# Patient Record
Sex: Female | Born: 1970 | Race: White | Hispanic: No | Marital: Married | Smoking: Former smoker
Health system: Southern US, Community
[De-identification: ages and names within clinical notes are randomized; demographics above are authoritative.]

## PROBLEM LIST (undated history)

## (undated) DIAGNOSIS — F419 Anxiety disorder, unspecified: Secondary | ICD-10-CM

## (undated) DIAGNOSIS — F909 Attention-deficit hyperactivity disorder, unspecified type: Secondary | ICD-10-CM

## (undated) DIAGNOSIS — J45909 Unspecified asthma, uncomplicated: Secondary | ICD-10-CM

## (undated) HISTORY — PX: FOOT SURGERY: SHX648

## (undated) HISTORY — DX: Unspecified asthma, uncomplicated: J45.909

## (undated) HISTORY — DX: Anxiety disorder, unspecified: F41.9

## (undated) HISTORY — DX: Attention-deficit hyperactivity disorder, unspecified type: F90.9

---

## 2016-04-21 DIAGNOSIS — M9901 Segmental and somatic dysfunction of cervical region: Secondary | ICD-10-CM | POA: Diagnosis not present

## 2016-04-21 DIAGNOSIS — M9905 Segmental and somatic dysfunction of pelvic region: Secondary | ICD-10-CM | POA: Diagnosis not present

## 2016-04-21 DIAGNOSIS — M62838 Other muscle spasm: Secondary | ICD-10-CM | POA: Diagnosis not present

## 2016-04-21 DIAGNOSIS — M5432 Sciatica, left side: Secondary | ICD-10-CM | POA: Diagnosis not present

## 2016-04-25 DIAGNOSIS — M9901 Segmental and somatic dysfunction of cervical region: Secondary | ICD-10-CM | POA: Diagnosis not present

## 2016-04-25 DIAGNOSIS — M62838 Other muscle spasm: Secondary | ICD-10-CM | POA: Diagnosis not present

## 2016-04-25 DIAGNOSIS — M5432 Sciatica, left side: Secondary | ICD-10-CM | POA: Diagnosis not present

## 2016-04-25 DIAGNOSIS — M9905 Segmental and somatic dysfunction of pelvic region: Secondary | ICD-10-CM | POA: Diagnosis not present

## 2016-05-10 DIAGNOSIS — M5432 Sciatica, left side: Secondary | ICD-10-CM | POA: Diagnosis not present

## 2016-05-10 DIAGNOSIS — M9903 Segmental and somatic dysfunction of lumbar region: Secondary | ICD-10-CM | POA: Diagnosis not present

## 2016-05-12 DIAGNOSIS — M5432 Sciatica, left side: Secondary | ICD-10-CM | POA: Diagnosis not present

## 2016-05-12 DIAGNOSIS — M9903 Segmental and somatic dysfunction of lumbar region: Secondary | ICD-10-CM | POA: Diagnosis not present

## 2016-06-28 DIAGNOSIS — Z79899 Other long term (current) drug therapy: Secondary | ICD-10-CM | POA: Diagnosis not present

## 2016-06-28 DIAGNOSIS — F419 Anxiety disorder, unspecified: Secondary | ICD-10-CM | POA: Diagnosis not present

## 2016-06-28 DIAGNOSIS — R41840 Attention and concentration deficit: Secondary | ICD-10-CM | POA: Diagnosis not present

## 2016-06-28 DIAGNOSIS — F902 Attention-deficit hyperactivity disorder, combined type: Secondary | ICD-10-CM | POA: Diagnosis not present

## 2016-07-19 DIAGNOSIS — F419 Anxiety disorder, unspecified: Secondary | ICD-10-CM | POA: Diagnosis not present

## 2016-07-19 DIAGNOSIS — Z79899 Other long term (current) drug therapy: Secondary | ICD-10-CM | POA: Diagnosis not present

## 2016-07-19 DIAGNOSIS — F902 Attention-deficit hyperactivity disorder, combined type: Secondary | ICD-10-CM | POA: Diagnosis not present

## 2016-07-19 DIAGNOSIS — F88 Other disorders of psychological development: Secondary | ICD-10-CM | POA: Diagnosis not present

## 2016-07-21 DIAGNOSIS — M9903 Segmental and somatic dysfunction of lumbar region: Secondary | ICD-10-CM | POA: Diagnosis not present

## 2016-07-21 DIAGNOSIS — M5432 Sciatica, left side: Secondary | ICD-10-CM | POA: Diagnosis not present

## 2016-07-22 DIAGNOSIS — F902 Attention-deficit hyperactivity disorder, combined type: Secondary | ICD-10-CM | POA: Diagnosis not present

## 2016-07-22 DIAGNOSIS — F338 Other recurrent depressive disorders: Secondary | ICD-10-CM | POA: Diagnosis not present

## 2016-07-25 DIAGNOSIS — M9903 Segmental and somatic dysfunction of lumbar region: Secondary | ICD-10-CM | POA: Diagnosis not present

## 2016-07-25 DIAGNOSIS — M5432 Sciatica, left side: Secondary | ICD-10-CM | POA: Diagnosis not present

## 2016-07-28 DIAGNOSIS — M9903 Segmental and somatic dysfunction of lumbar region: Secondary | ICD-10-CM | POA: Diagnosis not present

## 2016-07-28 DIAGNOSIS — M5432 Sciatica, left side: Secondary | ICD-10-CM | POA: Diagnosis not present

## 2016-08-02 DIAGNOSIS — M5432 Sciatica, left side: Secondary | ICD-10-CM | POA: Diagnosis not present

## 2016-08-02 DIAGNOSIS — M9903 Segmental and somatic dysfunction of lumbar region: Secondary | ICD-10-CM | POA: Diagnosis not present

## 2016-08-04 DIAGNOSIS — M9903 Segmental and somatic dysfunction of lumbar region: Secondary | ICD-10-CM | POA: Diagnosis not present

## 2016-08-04 DIAGNOSIS — M5432 Sciatica, left side: Secondary | ICD-10-CM | POA: Diagnosis not present

## 2016-08-05 DIAGNOSIS — Z79899 Other long term (current) drug therapy: Secondary | ICD-10-CM | POA: Diagnosis not present

## 2016-08-05 DIAGNOSIS — F338 Other recurrent depressive disorders: Secondary | ICD-10-CM | POA: Diagnosis not present

## 2016-08-05 DIAGNOSIS — F419 Anxiety disorder, unspecified: Secondary | ICD-10-CM | POA: Diagnosis not present

## 2016-08-05 DIAGNOSIS — F902 Attention-deficit hyperactivity disorder, combined type: Secondary | ICD-10-CM | POA: Diagnosis not present

## 2016-09-13 DIAGNOSIS — J0100 Acute maxillary sinusitis, unspecified: Secondary | ICD-10-CM | POA: Diagnosis not present

## 2016-09-13 DIAGNOSIS — J301 Allergic rhinitis due to pollen: Secondary | ICD-10-CM | POA: Diagnosis not present

## 2016-09-13 DIAGNOSIS — J4520 Mild intermittent asthma, uncomplicated: Secondary | ICD-10-CM | POA: Diagnosis not present

## 2016-09-27 DIAGNOSIS — R635 Abnormal weight gain: Secondary | ICD-10-CM | POA: Diagnosis not present

## 2016-09-27 DIAGNOSIS — Z0000 Encounter for general adult medical examination without abnormal findings: Secondary | ICD-10-CM | POA: Diagnosis not present

## 2016-09-27 DIAGNOSIS — Z23 Encounter for immunization: Secondary | ICD-10-CM | POA: Diagnosis not present

## 2016-10-07 ENCOUNTER — Other Ambulatory Visit (HOSPITAL_COMMUNITY)
Admission: RE | Admit: 2016-10-07 | Discharge: 2016-10-07 | Disposition: A | Discharge status: Home / Self Care | Payer: Federal, State, Local not specified - PPO | Source: Ambulatory Visit | Attending: Obstetrics & Gynecology | Admitting: Obstetrics & Gynecology

## 2016-10-07 ENCOUNTER — Other Ambulatory Visit: Payer: Self-pay | Admitting: Obstetrics & Gynecology

## 2016-10-07 DIAGNOSIS — Z1151 Encounter for screening for human papillomavirus (HPV): Secondary | ICD-10-CM | POA: Diagnosis not present

## 2016-10-07 DIAGNOSIS — Z01419 Encounter for gynecological examination (general) (routine) without abnormal findings: Secondary | ICD-10-CM | POA: Diagnosis not present

## 2016-10-11 LAB — CYTOLOGY - PAP
Diagnosis: NEGATIVE
HPV: NOT DETECTED

## 2016-10-26 ENCOUNTER — Encounter: Payer: Self-pay | Admitting: Allergy and Immunology

## 2016-10-26 ENCOUNTER — Ambulatory Visit (INDEPENDENT_AMBULATORY_CARE_PROVIDER_SITE_OTHER): Payer: Federal, State, Local not specified - PPO | Admitting: Allergy and Immunology

## 2016-10-26 VITALS — BP 116/84 | HR 77 | Temp 98.0°F | Resp 16 | Ht 68.0 in | Wt 228.6 lb

## 2016-10-26 DIAGNOSIS — J3089 Other allergic rhinitis: Secondary | ICD-10-CM

## 2016-10-26 DIAGNOSIS — Z91018 Allergy to other foods: Secondary | ICD-10-CM | POA: Diagnosis not present

## 2016-10-26 DIAGNOSIS — L718 Other rosacea: Secondary | ICD-10-CM | POA: Diagnosis not present

## 2016-10-26 DIAGNOSIS — J4520 Mild intermittent asthma, uncomplicated: Secondary | ICD-10-CM | POA: Diagnosis not present

## 2016-10-26 DIAGNOSIS — J4540 Moderate persistent asthma, uncomplicated: Secondary | ICD-10-CM | POA: Insufficient documentation

## 2016-10-26 DIAGNOSIS — Z881 Allergy status to other antibiotic agents status: Secondary | ICD-10-CM | POA: Diagnosis not present

## 2016-10-26 MED ORDER — AZELASTINE HCL 0.15 % NA SOLN: 2.0000 | Freq: Two times a day (BID) | NASAL | 5 refills | Status: DC

## 2016-10-26 NOTE — Assessment & Plan Note (Signed)
Skin tests to select food allergens were negative today. The negative predictive value of food allergen skin testing is excellent (approximately 95%). While this does not appear to be an IgE mediated issue, skin testing does not rule out food intolerances. This is suggested when elimination of the responsible food leads to symptom resolution and re-introduction of the food is followed by the return of symptoms.   The patient has been encouraged to keep a careful symptom/food journal and eliminate any food suspected of correlating with symptoms.   If symptoms persist or progress, rheumatologist evaluation may be warranted.

## 2016-10-26 NOTE — Progress Notes (Signed)
New Patient Note  RE: Wendy Shelton MRN: [id number] DOB: Mar 24, 1971 Date of Office Visit: 10/26/2016  Referring provider: Lorenda Ishihara,* Primary care provider: Lorenda Ishihara, MD  Chief Complaint: Allergic Reaction (Amoxicillin); Allergic Rhinitis ; and Asthma   History of present illness: Wendy Shelton is a 46 y.o. female seen today in consultation requested by Lorenda Ishihara, MD.  In April, she was prescribed amoxicillin and within 3-5 days of starting this course developed a "bad reaction" in the form of a rash.  The rash is described as tiny, red, bumps on her arms, abdomen, chest, and back.  The rash was exquisitely pruritic and she believes that she may have experienced concomitant dyspnea.The amoxicillin was discontinued approximately 2 days after the onset of symptoms and the rash lasted for an additional 2 weeks prior to resolving. The patient experiences coughing and wheezing with exertion.  She was diagnosed with exercise-induced bronchospasm during her mid 92s while living in [place].  She experiences postnasal drainage, throat clearing, coughing, sneezing, nasal congestion.  These symptoms have increased in frequency/severity since moving from [place] to [place] in October 2017. She develops red cheeks, red nose, and her nose becomes somewhat bulbous when she is in warm/hot environments and when she consumes red wine.  When she consumes large quantities of gluten, dairy, and/or sugar she experiences joint inflammation. She does not experience cutaneous, cardiopulmonary, or GI symptoms with the consumption of gluten, dairy, or sugar.   Assessment and plan: Drug allergy, antibiotic [name]'s history strongly suggests allergic reaction to amoxicillin.  Given the high pretest probability, low sensitivity/specificity of skin testing and risk involved with a challenge, the presumptive diagnosis of drug allergy is appropriate.   Continue  avoidance of this class of amoxicillin/penicillin.  Should symptoms recur in the absence of amoxicillin/penicillin, a journal is to be kept recording any foods eaten, beverages consumed, medications taken, activities performed, and environmental conditions within a 6 hour time period prior to the onset of symptoms. For any symptoms concerning for anaphylaxis, epinephrine is to be administered and 911 is to be called immediately.   Perennial and seasonal allergic rhinitis  Aeroallergen avoidance measures have been discussed and provided in written form.  A prescription has been provided for Dymista (azelastine/fluticasone) nasal spray, 1 spray per nostril twice daily as needed. Proper nasal spray technique has been discussed and demonstrated.  I have also recommended nasal saline spray (i.e., Simply Saline) or nasal saline lavage (i.e., NeilMed) as needed and prior to medicated nasal sprays.  For thick post nasal drainage, add guaifenesin 1200 mg (Mucinex Maximum Strength)  twice daily as needed with adequate hydration as discussed.  If allergen avoidance measures and medications fail to adequately relieve symptoms, aeroallergen immunotherapy will be considered.  Rosacea The patient's history suggests rosacea.  Avoidance of triggers, including heat and red wine.  If this problem persists or progresses despite trigger avoidance, follow up with dermatology for further treatment.  History of food allergy Skin tests to select food allergens were negative today. The negative predictive value of food allergen skin testing is excellent (approximately 95%). While this does not appear to be an IgE mediated issue, skin testing does not rule out food intolerances. This is suggested when elimination of the responsible food leads to symptom resolution and re-introduction of the food is followed by the return of symptoms.   The patient has been encouraged to keep a careful symptom/food journal and  eliminate any food suspected of correlating with symptoms.  If symptoms persist or progress, rheumatologist evaluation may be warranted.  Mild intermittent asthma  Continue albuterol HFA, 1-2 inhalations every 4-6 hours as needed.  Subjective and objective measures of pulmonary function will be followed and the treatment plan will be adjusted accordingly.   Meds ordered this encounter  Medications  . Azelastine HCl 0.15 % SOLN    Sig: Place 2 sprays into both nostrils 2 (two) times daily.    Dispense:  30 mL    Refill:  5    Diagnostics: Spirometry: FVC was 3.48 L and FEV1 was 3.02 L (85% predicted) with 150 mL postbronchodilator improvement. This study was performed while the patient was asymptomatic.  Please see scanned spirometry results for details. Epicutaneous testing: Positive to grass pollens, cat hair, and dust mite antigen. Intradermal testing: Positive to ragweed pollen, weed pollens, tree pollens, molds, dog epithelia, and cockroach antigen.    Physical examination: Blood pressure 116/84, pulse 77, temperature 98 F (36.7 C), temperature source Oral, resp. rate 16, height 5\' 8"  (1.727 m), weight 228 lb 9.9 oz (103.7 kg), SpO2 99 %.  General: Alert, interactive, in no acute distress. HEENT: TMs pearly gray, turbinates edematous with thick discharge, post-pharynx moderately erythematous. Neck: Supple without lymphadenopathy. Lungs: Clear to auscultation without wheezing, rhonchi or rales. CV: Normal S1, S2 without murmurs. Abdomen: Nondistended, nontender. Skin: Warm and dry, without lesions or rashes. Extremities:  No clubbing, cyanosis or edema. Neuro:   Grossly intact.  Review of systems:  Review of systems negative except as noted in HPI / PMHx or noted below: Review of Systems  Constitutional: Negative.   HENT: Negative.   Eyes: Negative.   Respiratory: Negative.   Cardiovascular: Negative.   Gastrointestinal: Negative.   Genitourinary: Negative.     Musculoskeletal: Negative.   Skin: Negative.   Neurological: Negative.   Endo/Heme/Allergies: Negative.   Psychiatric/Behavioral: Negative.     Past medical history:  Past Medical History:  Diagnosis Date  . ADHD   . Anxiety   . Asthma     Past surgical history:  Past Surgical History:  Procedure Laterality Date  . no past surgery      Family history: Family History  Problem Relation Age of Onset  . Allergic rhinitis Mother   . Asthma Brother   . Angioedema Neg Hx   . Eczema Neg Hx   . Immunodeficiency Neg Hx   . Urticaria Neg Hx     Social history: Social History   Social History  . Marital status: Single    Spouse name: N/A  . Number of children: N/A  . Years of education: N/A   Occupational History  . Not on file.   Social History Main Topics  . Smoking status: Former Smoker    Packs/day: 0.25    Years: 15.00    Types: Cigarettes  . Smokeless tobacco: Never Used  . Alcohol use Yes  . Drug use: No  . Sexual activity: Not on file   Other Topics Concern  . Not on file   Social History Narrative  . No narrative on file   Environmental History: The patient lives in a 44 year old house with hardwood floors throughout and central air/heat.  There is a dog in the house and has access to her bedroom.  There is no known mold/water damage in the home.  She started smoking in 1988 and quit in 2009  Allergies as of 10/26/2016      Reactions   Amoxicillin    rash  Keflex [cephalexin]    rash   Sulfa Antibiotics    Rash      Medication List       Accurate as of 10/26/16  6:21 PM. Always use your most recent med list.          ALPRAZolam 0.25 MG tablet Commonly known as:  XANAX Take 0.25 mg by mouth at bedtime as needed for anxiety.   atomoxetine 40 MG capsule Commonly known as:  STRATTERA TK 1 C PO D   Azelastine HCl 0.15 % Soln Place 2 sprays into both nostrils 2 (two) times daily.   dexmethylphenidate 20 MG 24 hr capsule Commonly known  as:  FOCALIN XR TK 1 C PO D   mupirocin ointment 2 % Commonly known as:  BACTROBAN APP EXT AA TID FOR 5 DAYS   PROAIR HFA 108 (90 Base) MCG/ACT inhaler Generic drug:  albuterol INL 2 PFS PO Q 6 H PRN       Known medication allergies: Allergies  Allergen Reactions  . Amoxicillin     rash  . Keflex [Cephalexin]     rash  . Sulfa Antibiotics     Rash     I appreciate the opportunity to take part in [name]'s care. Please do not hesitate to contact me with questions.  Sincerely,   R. Jorene Guestarter Leila Schuff, MD

## 2016-10-26 NOTE — Assessment & Plan Note (Signed)
   Continue albuterol HFA, 1-2 inhalations every 4-6 hours as needed.  Subjective and objective measures of pulmonary function will be followed and the treatment plan will be adjusted accordingly. 

## 2016-10-26 NOTE — Assessment & Plan Note (Signed)
   Aeroallergen avoidance measures have been discussed and provided in written form.  A prescription has been provided for Dymista (azelastine/fluticasone) nasal spray, 1 spray per nostril twice daily as needed. Proper nasal spray technique has been discussed and demonstrated.  I have also recommended nasal saline spray (i.e., Simply Saline) or nasal saline lavage (i.e., NeilMed) as needed and prior to medicated nasal sprays.  For thick post nasal drainage, add guaifenesin 1200 mg (Mucinex Maximum Strength)  twice daily as needed with adequate hydration as discussed.  If allergen avoidance measures and medications fail to adequately relieve symptoms, aeroallergen immunotherapy will be considered.

## 2016-10-26 NOTE — Patient Instructions (Addendum)
Drug allergy, antibiotic [name]'s history strongly suggests allergic reaction to amoxicillin.  Given the high pretest probability, low sensitivity/specificity of skin testing and risk involved with a challenge, the presumptive diagnosis of drug allergy is appropriate.   Continue avoidance of this class of amoxicillin/penicillin.  Should symptoms recur in the absence of amoxicillin/penicillin, a journal is to be kept recording any foods eaten, beverages consumed, medications taken, activities performed, and environmental conditions within a 6 hour time period prior to the onset of symptoms. For any symptoms concerning for anaphylaxis, epinephrine is to be administered and 911 is to be called immediately.   Perennial and seasonal allergic rhinitis  Aeroallergen avoidance measures have been discussed and provided in written form.  A prescription has been provided for Dymista (azelastine/fluticasone) nasal spray, 1 spray per nostril twice daily as needed. Proper nasal spray technique has been discussed and demonstrated.  I have also recommended nasal saline spray (i.e., Simply Saline) or nasal saline lavage (i.e., NeilMed) as needed and prior to medicated nasal sprays.  For thick post nasal drainage, add guaifenesin 1200 mg (Mucinex Maximum Strength)  twice daily as needed with adequate hydration as discussed.  If allergen avoidance measures and medications fail to adequately relieve symptoms, aeroallergen immunotherapy will be considered.  Rosacea The patient's history suggests rosacea.  Avoidance of triggers, including heat and red wine.  If this problem persists or progresses despite trigger avoidance, follow up with dermatology for further treatment.  History of food allergy Skin tests to select food allergens were negative today. The negative predictive value of food allergen skin testing is excellent (approximately 95%). While this does not appear to be an IgE mediated issue, skin  testing does not rule out food intolerances. This is suggested when elimination of the responsible food leads to symptom resolution and re-introduction of the food is followed by the return of symptoms.   The patient has been encouraged to keep a careful symptom/food journal and eliminate any food suspected of correlating with symptoms.   If symptoms persist or progress, rheumatologist evaluation may be warranted.  Mild intermittent asthma  Continue albuterol HFA, 1-2 inhalations every 4-6 hours as needed.  Subjective and objective measures of pulmonary function will be followed and the treatment plan will be adjusted accordingly.   Return in about 4 months (around 02/25/2017), or if symptoms worsen or fail to improve.  Reducing Pollen Exposure  The American Academy of Allergy, Asthma and Immunology suggests the following steps to reduce your exposure to pollen during allergy seasons.    1. Do not hang sheets or clothing out to dry; pollen may collect on these items. 2. Do not mow lawns or spend time around freshly cut grass; mowing stirs up pollen. 3. Keep windows closed at night.  Keep car windows closed while driving. 4. Minimize morning activities outdoors, a time when pollen counts are usually at their highest. 5. Stay indoors as much as possible when pollen counts or humidity is high and on windy days when pollen tends to remain in the air longer. 6. Use air conditioning when possible.  Many air conditioners have filters that trap the pollen spores. 7. Use a HEPA room air filter to remove pollen form the indoor air you breathe.   Control of House Dust Mite Allergen  House dust mites play a major role in allergic asthma and rhinitis.  They occur in environments with high humidity wherever human skin, the food for dust mites is found. High levels have been detected in dust  obtained from mattresses, pillows, carpets, upholstered furniture, bed covers, clothes and soft toys.  The  principal allergen of the house dust mite is found in its feces.  A gram of dust may contain 1,000 mites and 250,000 fecal particles.  Mite antigen is easily measured in the air during house cleaning activities.    1. Encase mattresses, including the box spring, and pillow, in an air tight cover.  Seal the zipper end of the encased mattresses with wide adhesive tape. 2. Wash the bedding in water of 130 degrees Farenheit weekly.  Avoid cotton comforters/quilts and flannel bedding: the most ideal bed covering is the dacron comforter. 3. Remove all upholstered furniture from the bedroom. 4. Remove carpets, carpet padding, rugs, and non-washable window drapes from the bedroom.  Wash drapes weekly or use plastic window coverings. 5. Remove all non-washable stuffed toys from the bedroom.  Wash stuffed toys weekly. 6. Have the room cleaned frequently with a vacuum cleaner and a damp dust-mop.  The patient should not be in a room which is being cleaned and should wait 1 hour after cleaning before going into the room. 7. Close and seal all heating outlets in the bedroom.  Otherwise, the room will become filled with dust-laden air.  An electric heater can be used to heat the room. Reduce indoor humidity to less than 50%.  Do not use a humidifier.  Control of Dog or Cat Allergen  Avoidance is the best way to manage a dog or cat allergy. If you have a dog or cat and are allergic to dog or cats, consider removing the dog or cat from the home. If you have a dog or cat but don't want to find it a new home, or if your family wants a pet even though someone in the household is allergic, here are some strategies that may help keep symptoms at bay:  1. Keep the pet out of your bedroom and restrict it to only a few rooms. Be advised that keeping the dog or cat in only one room will not limit the allergens to that room. 2. Don't pet, hug or kiss the dog or cat; if you do, wash your hands with soap and  water. 3. High-efficiency particulate air (HEPA) cleaners run continuously in a bedroom or living room can reduce allergen levels over time. 4. Regular use of a high-efficiency vacuum cleaner or a central vacuum can reduce allergen levels. 5. Giving your dog or cat a bath at least once a week can reduce airborne allergen.  Control of Mold Allergen  Mold and fungi can grow on a variety of surfaces provided certain temperature and moisture conditions exist.  Outdoor molds grow on plants, decaying vegetation and soil.  The major outdoor mold, Alternaria and Cladosporium, are found in very high numbers during hot and dry conditions.  Generally, a late Summer - Fall peak is seen for common outdoor fungal spores.  Rain will temporarily lower outdoor mold spore count, but counts rise rapidly when the rainy period ends.  The most important indoor molds are Aspergillus and Penicillium.  Dark, humid and poorly ventilated basements are ideal sites for mold growth.  The next most common sites of mold growth are the bathroom and the kitchen.  Outdoor [employer] 1. Use air conditioning and keep windows closed 2. Avoid exposure to decaying vegetation. 3. Avoid leaf raking. 4. Avoid grain handling. 5. Consider wearing a face mask if working in moldy areas.  Indoor Mold Control 1. Maintain  humidity below 50%. 2. Clean washable surfaces with 5% bleach solution. 3. Remove sources e.g. Contaminated carpets.  Control of Cockroach Allergen  Cockroach allergen has been identified as an important cause of acute attacks of asthma, especially in urban settings.  There are fifty-five species of cockroach that exist in the [place], however only three, the [place], [place] species produce allergen that can affect patients with Asthma.  Allergens can be obtained from fecal particles, egg casings and secretions from cockroaches.    1. Remove food sources. 2. Reduce access to water. 3. Seal access  and entry points. 4. Spray runways with 0.5-1% Diazinon or Chlorpyrifos 5. Blow boric acid power under stoves and refrigerator. 6. Place bait stations (hydramethylnon) at feeding sites.

## 2016-10-26 NOTE — Assessment & Plan Note (Signed)
The patient's history suggests rosacea.  Avoidance of triggers, including heat and red wine.  If this problem persists or progresses despite trigger avoidance, follow up with dermatology for further treatment.

## 2016-10-26 NOTE — Assessment & Plan Note (Addendum)
Wendy Shelton's history strongly suggests allergic reaction to amoxicillin.  Given the high pretest probability, low sensitivity/specificity of skin testing and risk involved with a challenge, the presumptive diagnosis of drug allergy is appropriate.   Continue avoidance of this class of amoxicillin/penicillin.  Should symptoms recur in the absence of amoxicillin/penicillin, a journal is to be kept recording any foods eaten, beverages consumed, medications taken, activities performed, and environmental conditions within a 6 hour time period prior to the onset of symptoms. For any symptoms concerning for anaphylaxis, epinephrine is to be administered and 911 is to be called immediately.

## 2016-11-01 ENCOUNTER — Ambulatory Visit (INDEPENDENT_AMBULATORY_CARE_PROVIDER_SITE_OTHER): Payer: Federal, State, Local not specified - PPO | Admitting: Podiatry

## 2016-11-01 ENCOUNTER — Encounter: Payer: Self-pay | Admitting: Podiatry

## 2016-11-01 ENCOUNTER — Ambulatory Visit (INDEPENDENT_AMBULATORY_CARE_PROVIDER_SITE_OTHER): Payer: Federal, State, Local not specified - PPO

## 2016-11-01 DIAGNOSIS — M2012 Hallux valgus (acquired), left foot: Secondary | ICD-10-CM

## 2016-11-01 DIAGNOSIS — M2042 Other hammer toe(s) (acquired), left foot: Secondary | ICD-10-CM

## 2016-11-01 NOTE — Progress Notes (Signed)
   Subjective:    Patient ID: Wendy Shelton, female    DOB: 06/01/1970, 46 y.o.   MRN: [id number]  HPI: She presents today with a chief concern of pain to the first metatarsophalangeal joint left foot she is also concerned about deviation of the second toe left foot and painful fifth metatarsal phalangeal joint left. She states is an aching for several months has been going on for a couple of years on and off. She states that she had a plantar plate tear couple of years ago around the second metatarsophalangeal joint which was booted for about 2 months and resolved. She states that now the second toe is starting to curl and leaning toward her hallux. She denies any problems with her health in the past.    Review of Systems  Musculoskeletal: Positive for arthralgias.  All other systems reviewed and are negative.      Objective:   Physical Exam: I have reviewed her past medical history medications allergy surgery social history. She is in no acute distress. Pulses are strongly palpable. Neurologic sensorium is intact. Deep tendon reflexes are intact. Muscle strength +5 over 5 dorsiflexion plantar flexors and inverters everters all intrinsic musculature is intact. Orthopedic evaluation demonstrates hallux abductovalgus deformity of the left foot. Contracted second metatarsophalangeal joint with medial deviation and hammertoe deformity second digit left. Tailor's bunion deformity is also present painful on palpation and range of motion. Radiographs taken today demonstrate an increase in the first and fourth intermetatarsal angles hallux abductus angle is increased greater than normal value. An elongated second metatarsal was noted resulting in hammertoe deformity rigid hammertoe deformity at the level of the second toe. No open lesions or wounds are noted. She also has hammertoe deformities to toes #3 and 4.        Assessment & Plan:  Hallux abductovalgus deformity left foot plantarflexed  elongated second metatarsal left foot hammertoe #2 #3 #4 of the left foot. Tailor's bunion deformity fifth left.  Plan: Discussed etiology pathology conservative versus surgical therapies. We will follow-up with her in the fall for urgent consideration.

## 2016-11-07 ENCOUNTER — Ambulatory Visit: Payer: Self-pay | Admitting: Allergy & Immunology

## 2016-11-15 DIAGNOSIS — Z1231 Encounter for screening mammogram for malignant neoplasm of breast: Secondary | ICD-10-CM | POA: Diagnosis not present

## 2016-12-01 DIAGNOSIS — Z79899 Other long term (current) drug therapy: Secondary | ICD-10-CM | POA: Diagnosis not present

## 2016-12-01 DIAGNOSIS — F1920 Other psychoactive substance dependence, uncomplicated: Secondary | ICD-10-CM | POA: Diagnosis not present

## 2016-12-01 DIAGNOSIS — F419 Anxiety disorder, unspecified: Secondary | ICD-10-CM | POA: Diagnosis not present

## 2016-12-01 DIAGNOSIS — F902 Attention-deficit hyperactivity disorder, combined type: Secondary | ICD-10-CM | POA: Diagnosis not present

## 2016-12-01 DIAGNOSIS — G4733 Obstructive sleep apnea (adult) (pediatric): Secondary | ICD-10-CM | POA: Diagnosis not present

## 2016-12-06 ENCOUNTER — Encounter (INDEPENDENT_AMBULATORY_CARE_PROVIDER_SITE_OTHER): Payer: Self-pay | Admitting: Orthopaedic Surgery

## 2016-12-06 ENCOUNTER — Ambulatory Visit (INDEPENDENT_AMBULATORY_CARE_PROVIDER_SITE_OTHER): Payer: Federal, State, Local not specified - PPO | Admitting: Orthopaedic Surgery

## 2016-12-06 ENCOUNTER — Ambulatory Visit (INDEPENDENT_AMBULATORY_CARE_PROVIDER_SITE_OTHER): Payer: Self-pay

## 2016-12-06 DIAGNOSIS — M25562 Pain in left knee: Secondary | ICD-10-CM | POA: Diagnosis not present

## 2016-12-06 MED ORDER — METHYLPREDNISOLONE ACETATE 40 MG/ML IJ SUSP
40.0000 mg | INTRAMUSCULAR | Status: AC | PRN
  Administered 2016-12-06: 40 mg via INTRA_ARTICULAR

## 2016-12-06 MED ORDER — BUPIVACAINE HCL 0.5 % IJ SOLN
2.0000 mL | INTRAMUSCULAR | Status: AC | PRN
  Administered 2016-12-06: 2 mL via INTRA_ARTICULAR

## 2016-12-06 MED ORDER — LIDOCAINE HCL 1 % IJ SOLN
2.0000 mL | INTRAMUSCULAR | Status: AC | PRN
  Administered 2016-12-06: 2 mL

## 2016-12-06 NOTE — Progress Notes (Signed)
Office Visit Note   Patient: Wendy Shelton           Date of Birth: 1970-09-06           MRN: [id number] Visit Date: 12/06/2016              Requested by: Lorenda Ishihara, MD [address] [place], [place] [address] PCP: Lorenda Ishihara, MD   Assessment & Plan: Visit Diagnoses:  1. 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 I recommend a neoprene knee brace, physical therapy for quadriceps strengthening, ergonomic desk, weight loss. Cortisone injection was also performed today. Follow-up as needed.  Follow-Up Instructions: Return if symptoms worsen or fail to improve.   Orders:  Orders Placed This Encounter  Procedures  . XR KNEE 3 VIEW LEFT   No orders of the defined types were placed in this encounter.     Procedures: Large Joint Inj Date/Time: 12/06/2016 7:21 PM Performed by: Tarry Kos Authorized by: Tarry Kos   Consent Given by:  Patient Timeout: prior to procedure the correct patient, procedure, and site was verified   Indications:  Pain Location:  Knee Site:  L knee Prep: patient was prepped and draped in usual sterile fashion   Needle Size:  22 G Ultrasound Guidance: No   Fluoroscopic Guidance: No   Arthrogram: No   Medications:  2 mL lidocaine 1 %; 2 mL bupivacaine 0.5 %; 40 mg methylPREDNISolone acetate 40 MG/ML Patient tolerance:  Patient tolerated the procedure well with no immediate complications     Clinical Data: No additional findings.   Subjective: Chief Complaint  Patient presents with  . Left Knee - Pain    Wendy Shelton is a 46 year old female with left knee pain since May. Felt a cracking and tearing sensation when she got up from a chair. It did initially swell but now has gotten better. She is also experience grinding and tightness within the knee. She takes NSAIDs as needed. The knee feels weak. Denies any numbness or tingling. Denies any  injuries.    Review of Systems  Constitutional: Negative.   HENT: Negative.   Eyes: Negative.   Respiratory: Negative.   Cardiovascular: Negative.   Endocrine: Negative.   Musculoskeletal: Negative.   Neurological: Negative.   Hematological: Negative.   Psychiatric/Behavioral: Negative.   All other systems reviewed and are negative.    Objective: Vital Signs: There were no vitals taken for this visit.  Physical Exam  Constitutional: She is oriented to person, place, and time. She appears well-developed and well-nourished.  HENT:  Head: Normocephalic and atraumatic.  Eyes: EOM are normal.  Neck: Neck supple.  Pulmonary/Chest: Effort normal.  Abdominal: Soft.  Neurological: She is alert and oriented to person, place, and time.  Skin: Skin is warm. Capillary refill takes less than 2 seconds.  Psychiatric: She has a normal mood and affect. Her behavior is normal. Judgment and thought content normal.  Nursing note and vitals reviewed.   Ortho Exam Left knee exam shows a trace effusion. Collaterals and cruciates are stable. No joint line tenderness. Positive patellar crepitus. Specialty Comments:  No specialty comments available.  Imaging: Xr Knee 3 View Left  Result Date: 12/06/2016 Mild to moderate osteoarthritis. Preserved joint space    PMFS History: Patient Active Problem List   Diagnosis Date Noted  . Drug allergy, antibiotic 10/26/2016  . Perennial and seasonal allergic rhinitis 10/26/2016  . Rosacea 10/26/2016  .  History of food allergy 10/26/2016  . Mild intermittent asthma 10/26/2016   Past Medical History:  Diagnosis Date  . ADHD   . Anxiety   . Asthma     Family History  Problem Relation Age of Onset  . Allergic rhinitis Mother   . Asthma Brother   . Angioedema Neg Hx   . Eczema Neg Hx   . Immunodeficiency Neg Hx   . Urticaria Neg Hx     Past Surgical History:  Procedure Laterality Date  . no past surgery     Social History    Occupational History  . Not on file.   Social History Main Topics  . Smoking status: Former Smoker    Packs/day: 0.25    Years: 15.00    Types: Cigarettes  . Smokeless tobacco: Never Used  . Alcohol use Yes  . Drug use: No  . Sexual activity: Not on file

## 2016-12-07 ENCOUNTER — Telehealth (INDEPENDENT_AMBULATORY_CARE_PROVIDER_SITE_OTHER): Payer: Self-pay | Admitting: Orthopaedic Surgery

## 2016-12-07 NOTE — Telephone Encounter (Signed)
IC advised. She said that her knee actually was very sore and tender. Advised her that this was normal. Told her she should take anti-inflammatories, ice and rest

## 2016-12-07 NOTE — Telephone Encounter (Signed)
Patient called left voicemail message asking if she can ride her bike tonight. Patient said she felt sick after the injection and asked if this was normal. The number to contact patient is  [phone]

## 2016-12-07 NOTE — Telephone Encounter (Signed)
Please advise 

## 2016-12-07 NOTE — Telephone Encounter (Signed)
Yeah that can be a side effect.  Yes she can ride

## 2016-12-13 DIAGNOSIS — M25562 Pain in left knee: Secondary | ICD-10-CM | POA: Diagnosis not present

## 2016-12-13 DIAGNOSIS — M222X2 Patellofemoral disorders, left knee: Secondary | ICD-10-CM | POA: Diagnosis not present

## 2016-12-13 DIAGNOSIS — M2242 Chondromalacia patellae, left knee: Secondary | ICD-10-CM | POA: Diagnosis not present

## 2016-12-15 DIAGNOSIS — M25562 Pain in left knee: Secondary | ICD-10-CM | POA: Diagnosis not present

## 2016-12-15 DIAGNOSIS — M222X2 Patellofemoral disorders, left knee: Secondary | ICD-10-CM | POA: Diagnosis not present

## 2016-12-15 DIAGNOSIS — M2242 Chondromalacia patellae, left knee: Secondary | ICD-10-CM | POA: Diagnosis not present

## 2016-12-20 DIAGNOSIS — M25562 Pain in left knee: Secondary | ICD-10-CM | POA: Diagnosis not present

## 2016-12-20 DIAGNOSIS — M2242 Chondromalacia patellae, left knee: Secondary | ICD-10-CM | POA: Diagnosis not present

## 2016-12-20 DIAGNOSIS — M222X2 Patellofemoral disorders, left knee: Secondary | ICD-10-CM | POA: Diagnosis not present

## 2016-12-22 DIAGNOSIS — F4323 Adjustment disorder with mixed anxiety and depressed mood: Secondary | ICD-10-CM | POA: Diagnosis not present

## 2016-12-28 DIAGNOSIS — M25562 Pain in left knee: Secondary | ICD-10-CM | POA: Diagnosis not present

## 2016-12-28 DIAGNOSIS — M222X2 Patellofemoral disorders, left knee: Secondary | ICD-10-CM | POA: Diagnosis not present

## 2016-12-28 DIAGNOSIS — M2242 Chondromalacia patellae, left knee: Secondary | ICD-10-CM | POA: Diagnosis not present

## 2016-12-29 ENCOUNTER — Telehealth (INDEPENDENT_AMBULATORY_CARE_PROVIDER_SITE_OTHER): Payer: Self-pay | Admitting: Orthopaedic Surgery

## 2016-12-29 NOTE — Telephone Encounter (Signed)
Please advise 

## 2016-12-29 NOTE — Telephone Encounter (Signed)
yes

## 2016-12-29 NOTE — Telephone Encounter (Signed)
Patient called asking for a RX for a stand up desk. CB # [phone]

## 2016-12-29 NOTE — Telephone Encounter (Signed)
Called no answer LMOM to come by to pick up Rx

## 2016-12-30 DIAGNOSIS — M2242 Chondromalacia patellae, left knee: Secondary | ICD-10-CM | POA: Diagnosis not present

## 2016-12-30 DIAGNOSIS — M222X2 Patellofemoral disorders, left knee: Secondary | ICD-10-CM | POA: Diagnosis not present

## 2016-12-30 DIAGNOSIS — M25562 Pain in left knee: Secondary | ICD-10-CM | POA: Diagnosis not present

## 2017-01-03 DIAGNOSIS — M2242 Chondromalacia patellae, left knee: Secondary | ICD-10-CM | POA: Diagnosis not present

## 2017-01-03 DIAGNOSIS — M25562 Pain in left knee: Secondary | ICD-10-CM | POA: Diagnosis not present

## 2017-01-03 DIAGNOSIS — M222X2 Patellofemoral disorders, left knee: Secondary | ICD-10-CM | POA: Diagnosis not present

## 2017-01-05 DIAGNOSIS — F4323 Adjustment disorder with mixed anxiety and depressed mood: Secondary | ICD-10-CM | POA: Diagnosis not present

## 2017-01-10 DIAGNOSIS — M222X2 Patellofemoral disorders, left knee: Secondary | ICD-10-CM | POA: Diagnosis not present

## 2017-01-10 DIAGNOSIS — M2242 Chondromalacia patellae, left knee: Secondary | ICD-10-CM | POA: Diagnosis not present

## 2017-01-10 DIAGNOSIS — M25562 Pain in left knee: Secondary | ICD-10-CM | POA: Diagnosis not present

## 2017-01-12 DIAGNOSIS — F4323 Adjustment disorder with mixed anxiety and depressed mood: Secondary | ICD-10-CM | POA: Diagnosis not present

## 2017-01-12 DIAGNOSIS — M25562 Pain in left knee: Secondary | ICD-10-CM | POA: Diagnosis not present

## 2017-01-12 DIAGNOSIS — M222X2 Patellofemoral disorders, left knee: Secondary | ICD-10-CM | POA: Diagnosis not present

## 2017-01-12 DIAGNOSIS — M2242 Chondromalacia patellae, left knee: Secondary | ICD-10-CM | POA: Diagnosis not present

## 2017-01-30 DIAGNOSIS — F4323 Adjustment disorder with mixed anxiety and depressed mood: Secondary | ICD-10-CM | POA: Diagnosis not present

## 2017-02-20 DIAGNOSIS — F4323 Adjustment disorder with mixed anxiety and depressed mood: Secondary | ICD-10-CM | POA: Diagnosis not present

## 2017-02-28 ENCOUNTER — Encounter: Payer: Self-pay | Admitting: Allergy and Immunology

## 2017-02-28 ENCOUNTER — Ambulatory Visit (INDEPENDENT_AMBULATORY_CARE_PROVIDER_SITE_OTHER): Payer: Federal, State, Local not specified - PPO | Admitting: Allergy and Immunology

## 2017-02-28 VITALS — BP 124/70 | HR 68 | Temp 98.3°F | Resp 16

## 2017-02-28 DIAGNOSIS — J3089 Other allergic rhinitis: Secondary | ICD-10-CM

## 2017-02-28 DIAGNOSIS — R0609 Other forms of dyspnea: Secondary | ICD-10-CM | POA: Diagnosis not present

## 2017-02-28 DIAGNOSIS — R05 Cough: Secondary | ICD-10-CM | POA: Diagnosis not present

## 2017-02-28 DIAGNOSIS — R053 Chronic cough: Secondary | ICD-10-CM

## 2017-02-28 MED ORDER — AZELASTINE-FLUTICASONE 137-50 MCG/ACT NA SUSP: 1.0000 | Freq: Two times a day (BID) | NASAL | 5 refills | Status: DC

## 2017-02-28 MED ORDER — CARBINOXAMINE MALEATE 6 MG PO TABS: 1.0000 | ORAL_TABLET | ORAL | 5 refills | Status: DC

## 2017-02-28 NOTE — Progress Notes (Signed)
Follow-up Note  RE: Wendy Shelton MRN: [id number] DOB: 06-Apr-1971 Date of Office Visit: 02/28/2017  Primary care provider: Lorenda Ishihara, MD Referring provider: Lorenda Shelton,*  History of present illness: Wendy Shelton is a 46 y.o. female with allergic rhinitis, intermittent asthma, and history of food allergy presenting today for follow up.  She was previously seen in this clinic for her initial evaluation on 10/26/2016.  She reports that over the past 3 weeks she has been experiencing nasal congestion, sinus congestion, mild ear pressure, thick postnasal drainage, persistent throat clearing, and coughing despite taking loratadine daily.  She denies fevers, chills, or discolored mucus production.  The cough is described as nonproductive and seems to originate as a tickle at the base of her throat.  She was taken Dymista which was working well, however she ran out of samples and did not have a coupon and so did not pick up a refill.  She has been using nasal saline irrigation with some relief. Wendy Shelton reports that when she is experiencing shortness of breath she believes that the albuterol makes things worse.  She describes difficulty with inspiration rather than expiration and states that it feels like she cannot get enough air in, however occasionally achieves a satisfying yawn which temporarily relieves the symptoms.   Assessment and plan: Perennial and seasonal allergic rhinitis Currently with suboptimal control.  Continue appropriate allergen avoidance measures.  A prescription has been provided for RyVent (carbinoxamine maleate)  every 6-8 hours as needed.  A refill per prescription has been provided for Dymista, one spray per nostril twice daily as needed.  Continue nasal saline irrigation as needed and prior to Dymista.  Discontinue loratadine as this medication may contribute to mucus viscosity.  Cough The patient's  history and physical examination suggest upper airway cough syndrome.  Spirometry today reveals normal ventilatory function. We will aggressively treat postnasal drainage and evaluate results.  Treatment plan as outlined above.  If the coughing persists or progresses despite this plan, we will evaluate further.  Dyspnea The patient's sensation of air-hunger, not being able to get a full breath on inspiration, which is relieved by a yawn suggests sighing dyspnea. The patient's spirometry today was normal.  Diaphragmatic breathing, or belly breathing, has been discussed with the patient as this technique often times relieves sighing dyspnea.  For now, continue to have access to albuterol HFA, if needed.  The patient's subjective and objective measures a pulmonary function will be followed and the treatment plan will be adjusted accordingly. We will recheck spirometry on the next visit.   Meds ordered this encounter  Medications  . Carbinoxamine Maleate (RYVENT) 6 MG TABS    Sig: Take 1 tablet by mouth See admin instructions. Every 6-8 hours as needed    Dispense:  60 tablet    Refill:  5    BIN [id number]   RX PCN [id number]   Group [id number]   Cardholder ID    [id number]  . Azelastine-Fluticasone (DYMISTA) 137-50 MCG/ACT SUSP    Sig: Place 1 spray into both nostrils 2 (two) times daily.    Dispense:  1 Bottle    Refill:  5    Diagnostics: Spirometry:  Normal with an FEV1 of 84% predicted.  Please see scanned spirometry results for details.    Physical examination: Blood pressure 124/70, pulse 68, temperature 98.3 F (36.8 C), temperature source Oral, resp. rate 16, SpO2 98 %.  General: Alert, interactive, in no acute distress. HEENT: TMs pearly gray, turbinates  edematous with thick discharge, post-pharynx erythematous. Neck: Supple without lymphadenopathy. Lungs: Clear to auscultation without wheezing, rhonchi or rales. CV: Normal S1, S2 without murmurs. Skin: Warm and dry, without  lesions or rashes.  The following portions of the patient's history were reviewed and updated as appropriate: allergies, current medications, past family history, past medical history, past social history, past surgical history and problem list.  Allergies as of 02/28/2017      Reactions   Amoxicillin    rash   Keflex [cephalexin]    rash   Sulfa Antibiotics    Rash      Medication List       Accurate as of 02/28/17  5:19 PM. Always use your most recent med list.          ALPRAZolam 0.25 MG tablet Commonly known as:  XANAX Take 0.25 mg by mouth at bedtime as needed for anxiety.   Azelastine-Fluticasone 137-50 MCG/ACT Susp Commonly known as:  DYMISTA Place 1 spray into both nostrils 2 (two) times daily.   Carbinoxamine Maleate 6 MG Tabs Commonly known as:  RYVENT Take 1 tablet by mouth See admin instructions. Every 6-8 hours as needed   dexmethylphenidate 20 MG 24 hr capsule Commonly known as:  FOCALIN XR TK 1 C PO D   PROAIR HFA 108 (90 Base) MCG/ACT inhaler Generic drug:  albuterol INL 2 PFS PO Q 6 H PRN   valACYclovir 1000 MG tablet Commonly known as:  VALTREX TAKE 2 TABLETS BY MOUTH 2 TIMES DAILY FOR 3-7 DAYS AS NEEDED       Allergies  Allergen Reactions  . Amoxicillin     rash  . Keflex [Cephalexin]     rash  . Sulfa Antibiotics     Rash    Review of systems: Review of systems negative except as noted in HPI / PMHx or noted below: Constitutional: Negative.  HENT: Negative.   Eyes: Negative.  Respiratory: Negative.   Cardiovascular: Negative.  Gastrointestinal: Negative.  Genitourinary: Negative.  Musculoskeletal: Negative.  Neurological: Negative.  Endo/Heme/Allergies: Negative.  Cutaneous: Negative.  Past Medical History:  Diagnosis Date  . ADHD   . Anxiety   . Asthma     Family History  Problem Relation Age of Onset  . Allergic rhinitis Mother   . Asthma Brother   . Angioedema Neg Hx   . Eczema Neg Hx   . Immunodeficiency Neg  Hx   . Urticaria Neg Hx     Social History   Social History  . Marital status: Single    Spouse name: N/A  . Number of children: N/A  . Years of education: N/A   Occupational History  . Not on file.   Social History Main Topics  . Smoking status: Former Smoker    Packs/day: 0.25    Years: 15.00    Types: Cigarettes  . Smokeless tobacco: Never Used  . Alcohol use Yes  . Drug use: No  . Sexual activity: Not on file   Other Topics Concern  . Not on file   Social History Narrative  . No narrative on file    I appreciate the opportunity to take part in Edell's care. Please do not hesitate to contact me with questions.  Sincerely,   R. Jorene Guest, MD

## 2017-02-28 NOTE — Assessment & Plan Note (Signed)
The patient's sensation of air-hunger, not being able to get a full breath on inspiration, which is relieved by a yawn suggests sighing dyspnea. The patient's spirometry today was normal.  Diaphragmatic breathing, or belly breathing, has been discussed with the patient as this technique often times relieves sighing dyspnea.  For now, continue to have access to albuterol HFA, if needed.  The patient's subjective and objective measures a pulmonary function will be followed and the treatment plan will be adjusted accordingly. We will recheck spirometry on the next visit.

## 2017-02-28 NOTE — Patient Instructions (Addendum)
Perennial and seasonal allergic rhinitis Currently with suboptimal control.  Continue appropriate allergen avoidance measures.  A prescription has been provided for RyVent (carbinoxamine maleate)  every 6-8 hours as needed.  A refill per prescription has been provided for Dymista, one spray per nostril twice daily as needed.  Continue nasal saline irrigation as needed and prior to Dymista.  Discontinue loratadine as this medication may contribute to mucus viscosity.  Cough The patient's history and physical examination suggest upper airway cough syndrome.  Spirometry today reveals normal ventilatory function. We will aggressively treat postnasal drainage and evaluate results.  Treatment plan as outlined above.  If the coughing persists or progresses despite this plan, we will evaluate further.  Dyspnea The patient's sensation of air-hunger, not being able to get a full breath on inspiration, which is relieved by a yawn suggests sighing dyspnea. The patient's spirometry today was normal.  Diaphragmatic breathing, or belly breathing, has been discussed with the patient as this technique often times relieves sighing dyspnea.  For now, continue to have access to albuterol HFA, if needed.  The patient's subjective and objective measures a pulmonary function will be followed and the treatment plan will be adjusted accordingly. We will recheck spirometry on the next visit.   Return in about 6 months (around 08/29/2017), or if symptoms worsen or fail to improve.

## 2017-02-28 NOTE — Assessment & Plan Note (Signed)
Currently with suboptimal control.  Continue appropriate allergen avoidance measures.  A prescription has been provided for RyVent (carbinoxamine maleate)  every 6-8 hours as needed.  A refill per prescription has been provided for Dymista, one spray per nostril twice daily as needed.  Continue nasal saline irrigation as needed and prior to Dymista.  Discontinue loratadine as this medication may contribute to mucus viscosity.

## 2017-02-28 NOTE — Assessment & Plan Note (Signed)
The patient's history and physical examination suggest upper airway cough syndrome.  Spirometry today reveals normal ventilatory function. We will aggressively treat postnasal drainage and evaluate results.  Treatment plan as outlined above.  If the coughing persists or progresses despite this plan, we will evaluate further. 

## 2017-03-01 ENCOUNTER — Telehealth: Payer: Self-pay

## 2017-03-01 DIAGNOSIS — F419 Anxiety disorder, unspecified: Secondary | ICD-10-CM | POA: Diagnosis not present

## 2017-03-01 DIAGNOSIS — F338 Other recurrent depressive disorders: Secondary | ICD-10-CM | POA: Diagnosis not present

## 2017-03-01 DIAGNOSIS — Z79899 Other long term (current) drug therapy: Secondary | ICD-10-CM | POA: Diagnosis not present

## 2017-03-01 DIAGNOSIS — F902 Attention-deficit hyperactivity disorder, combined type: Secondary | ICD-10-CM | POA: Diagnosis not present

## 2017-03-01 NOTE — Telephone Encounter (Signed)
Pts insurance will not cover dymista but will if we split it up? Is it ok to send in fluticasone and azelastine?  please advise

## 2017-03-01 NOTE — Telephone Encounter (Signed)
Call and ask pt is she would rather try Xhance. Thanks.

## 2017-03-02 ENCOUNTER — Ambulatory Visit (INDEPENDENT_AMBULATORY_CARE_PROVIDER_SITE_OTHER): Payer: Federal, State, Local not specified - PPO | Admitting: Podiatry

## 2017-03-02 ENCOUNTER — Encounter: Payer: Self-pay | Admitting: Podiatry

## 2017-03-02 ENCOUNTER — Other Ambulatory Visit: Payer: Self-pay

## 2017-03-02 DIAGNOSIS — M2012 Hallux valgus (acquired), left foot: Secondary | ICD-10-CM

## 2017-03-02 DIAGNOSIS — M2042 Other hammer toe(s) (acquired), left foot: Secondary | ICD-10-CM | POA: Diagnosis not present

## 2017-03-02 MED ORDER — FLUTICASONE PROPIONATE 93 MCG/ACT NA EXHU: 2.0000 | INHALANT_SUSPENSION | Freq: Two times a day (BID) | NASAL | 5 refills | Status: DC

## 2017-03-02 NOTE — Progress Notes (Signed)
She presents today with her husband to discuss surgical correction of her left foot. She states this started to affect her ability for daily activities and she notices that the toe seems to be moving up and over her great toe. She also has pains of fifth metatarsal with a painful lesion.  Objective: Vital signs are stable alert and oriented 3 at have reviewed her past medical history medications allergy surgery social history she appears to be a good risk for surgical intervention. At this point she still has pain on palpation second metatarsophalangeal joint with flexible hammertoe deformities #234 #5. Tailor's bunion deformity and hallux valgus deformity is also noted. She also has elongated plantar flex second metatarsal CONFIRMED on radiographs and evaluated.  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  Plan: We discussed surgical intervention today and signed the consent form. The consent is for an Medical illustrator second and fifth metatarsal osteotomy hammertoe repairs 23 and 4 with screws hammertoe repair #5 with no screw. We discussed the possible postop complications which may include but are not limited to postop pain bleeding swelling infection recurrence need for further surgery shows tendinosis and some amenable to it. She received both oral and written home-going instructions and will follow up with me in the near future for surgical intervention.

## 2017-03-02 NOTE — Telephone Encounter (Signed)
Pt said it was ok to send in xhance. [facility] and informed pt they would be contacting her for shipment

## 2017-03-02 NOTE — Patient Instructions (Signed)
Pre-Operative Instructions  Congratulations, you have decided to take an important step towards improving your quality of life.  You can be assured that the doctors and staff at [facility] will be with you every step of the way.  Here are some important things you should know:  1. Plan to be at the surgery center/hospital at least 1 (one) hour prior to your scheduled time, unless otherwise directed by the surgical center/hospital staff.  You must have a responsible adult accompany you, remain during the surgery and drive you home.  Make sure you have directions to the surgical center/hospital to ensure you arrive on time. 2. If you are having surgery at [facility] or [facility] hospitals, you will need a copy of your medical history and physical form from your family physician within one month prior to the date of surgery. We will give you a form for your primary physician to complete.  3. We make every effort to accommodate the date you request for surgery.  However, there are times where surgery dates or times have to be moved.  We will contact you as soon as possible if a change in schedule is required.   4. No aspirin/ibuprofen for one week before surgery.  If you are on aspirin, any non-steroidal anti-inflammatory medications (Mobic, Aleve, Ibuprofen) should not be taken seven (7) days prior to your surgery.  You make take Tylenol for pain prior to surgery.  5. Medications - If you are taking daily heart and blood pressure medications, seizure, reflux, allergy, asthma, anxiety, pain or diabetes medications, make sure you notify the surgery center/hospital before the day of surgery so they can tell you which medications you should take or avoid the day of surgery. 6. No food or drink after midnight the night before surgery unless directed otherwise by surgical center/hospital staff. 7. No alcoholic beverages 24-hours prior to surgery.  No smoking 24-hours prior or 24-hours after  surgery. 8. Wear loose pants or shorts. They should be loose enough to fit over bandages, boots, and casts. 9. Don't wear slip-on shoes. Sneakers are preferred. 10. Bring your boot with you to the surgery center/hospital.  Also bring crutches or a walker if your physician has prescribed it for you.  If you do not have this equipment, it will be provided for you after surgery. 11. If you have not been contacted by the surgery center/hospital by the day before your surgery, call to confirm the date and time of your surgery. 12. Leave-time from work may vary depending on the type of surgery you have.  Appropriate arrangements should be made prior to surgery with your employer. 13. Prescriptions will be provided immediately following surgery by your doctor.  Fill these as soon as possible after surgery and take the medication as directed. Pain medications will not be refilled on weekends and must be approved by the doctor. 14. Remove nail polish on the operative foot and avoid getting pedicures prior to surgery. 15. Wash the night before surgery.  The night before surgery wash the foot and leg well with water and the antibacterial soap provided. Be sure to pay special attention to beneath the toenails and in between the toes.  Wash for at least three (3) minutes. Rinse thoroughly with water and dry well with a towel.  Perform this wash unless told not to do so by your physician.  Enclosed: 1 Ice pack (please put in freezer the night before surgery)   1 Hibiclens skin cleaner     Pre-op instructions  If you have any questions regarding the instructions, please do not hesitate to call our office.  [place]: [address], [place], [place] [address] -- [phone]  [place]: [address] [place], [place] [address] -- [phone]  [place]: [address]  [place], [place] [address] -- [phone]  [place]: [address], [address], [place], [place] [address] -- [phone]  Website:  [email or web address] 

## 2017-03-07 ENCOUNTER — Telehealth: Payer: Self-pay | Admitting: Allergy

## 2017-03-07 NOTE — Telephone Encounter (Signed)
Wendy Shelton from the [facility] called for clarification of Xhance. She said it didn't come in 16ml it is . I okd which is two boxes.

## 2017-04-06 DIAGNOSIS — M2012 Hallux valgus (acquired), left foot: Secondary | ICD-10-CM | POA: Diagnosis not present

## 2017-04-06 DIAGNOSIS — M7742 Metatarsalgia, left foot: Secondary | ICD-10-CM | POA: Diagnosis not present

## 2017-04-06 DIAGNOSIS — M2042 Other hammer toe(s) (acquired), left foot: Secondary | ICD-10-CM | POA: Diagnosis not present

## 2017-04-07 ENCOUNTER — Telehealth: Payer: Self-pay | Admitting: *Deleted

## 2017-04-07 NOTE — Telephone Encounter (Signed)
"  I'm scheduled to have some Bunion surgery and some other stuff on my toe.  I think I have a tentative surgery date of January 18.  I guess just go ahead and schedule that.  I have a question for the doctor.  He's going to put pins in my toes. Will I eventually be able to flex my toes?  For example if I'm working out and doing a plank and you know how your toes are flexed in your foot, I am wondering if that's going to be a problem.  I'm having some anxiety and worry about post-op.  Wondering if there's any other information I can look up that he recommends."

## 2017-04-10 NOTE — Telephone Encounter (Signed)
Yes you will be able to flex and extend your toes at the knuckle joint.  The toes themselves will not bend.  If she has questions or concerns please have her in and I will discuss them with her.

## 2017-04-11 NOTE — Telephone Encounter (Signed)
I called and left her a message of Dr. Geryl RankinsHyatt's response.  I asked her to give me a call if she has any further questions and to call the main number if she needs to schedule an appointment with Dr. Al CorpusHyatt.

## 2017-04-17 ENCOUNTER — Telehealth: Payer: Self-pay | Admitting: *Deleted

## 2017-04-17 NOTE — Telephone Encounter (Signed)
"  Thank you for the return call.  I was wondering if I could have the surgery moved up to like January 4 or 11.  Otherwise just keep it for the 18th.  I just wanted to do that for work purposes.  We have someone moving to another job.  I'm trying to be accommodating."

## 2017-04-18 NOTE — Telephone Encounter (Signed)
I attempted to return her call.  I left her a message to call me back. 

## 2017-04-18 NOTE — Telephone Encounter (Signed)
"  I'm returning your call.  I had called about moving my surgery up to a sooner date."  Yes, do you have a date in mind?  "I'd like to move it to January 11."  That date is fine, I'll get it rescheduled.  "So, they'll call me the day before?"  Yes, it will be a day or two before surgery date.  "Is that all, there's nothing else I need to do?"  You need to register if you haven't done so already with the surgical center.  Instructions are in the [facility] brochure in your little gray bag.  "Okay, I forgot about that, I'll take care of that."

## 2017-05-29 ENCOUNTER — Telehealth: Payer: Self-pay | Admitting: *Deleted

## 2017-05-29 NOTE — Telephone Encounter (Signed)
"  I am scheduled to have surgery on Friday.  I am just wondering.  I don't know if I need to speak to you or the nurse to get the post operative instructions that he gave me when I met with him in October with Dr. Milinda Pointer."

## 2017-05-30 ENCOUNTER — Other Ambulatory Visit: Payer: Self-pay | Admitting: Podiatry

## 2017-05-30 DIAGNOSIS — F902 Attention-deficit hyperactivity disorder, combined type: Secondary | ICD-10-CM | POA: Diagnosis not present

## 2017-05-30 DIAGNOSIS — Z79899 Other long term (current) drug therapy: Secondary | ICD-10-CM | POA: Diagnosis not present

## 2017-05-30 MED ORDER — CLINDAMYCIN HCL 150 MG PO CAPS: 150.0000 mg | ORAL_CAPSULE | Freq: Three times a day (TID) | ORAL | 0 refills | Status: DC

## 2017-05-30 MED ORDER — HYDROMORPHONE HCL 4 MG PO TABS: 4.0000 mg | ORAL_TABLET | ORAL | 0 refills | Status: DC | PRN

## 2017-05-30 MED ORDER — PROMETHAZINE HCL 25 MG PO TABS: 25.0000 mg | ORAL_TABLET | Freq: Three times a day (TID) | ORAL | 0 refills | Status: DC | PRN

## 2017-06-01 NOTE — Telephone Encounter (Signed)
I attempted to return her call.  I left her a message to call me back. 

## 2017-06-01 NOTE — Telephone Encounter (Signed)
"  I'm sorry I missed your call.  I can't remember why I had called."  You said you were calling about your pre-operative instructions.  You need to clean your foot tonight with the scrub brush and do not eat or drink anything after midnight.  "Okay, thank you."

## 2017-06-02 ENCOUNTER — Encounter: Payer: Self-pay | Admitting: Podiatry

## 2017-06-02 DIAGNOSIS — M21542 Acquired clubfoot, left foot: Secondary | ICD-10-CM

## 2017-06-02 DIAGNOSIS — M2012 Hallux valgus (acquired), left foot: Secondary | ICD-10-CM

## 2017-06-02 DIAGNOSIS — G4730 Sleep apnea, unspecified: Secondary | ICD-10-CM | POA: Diagnosis not present

## 2017-06-02 DIAGNOSIS — M2042 Other hammer toe(s) (acquired), left foot: Secondary | ICD-10-CM

## 2017-06-02 DIAGNOSIS — M25572 Pain in left ankle and joints of left foot: Secondary | ICD-10-CM | POA: Diagnosis not present

## 2017-06-02 DIAGNOSIS — M216X2 Other acquired deformities of left foot: Secondary | ICD-10-CM | POA: Diagnosis not present

## 2017-06-02 DIAGNOSIS — M21612 Bunion of left foot: Secondary | ICD-10-CM | POA: Diagnosis not present

## 2017-06-05 ENCOUNTER — Telehealth: Payer: Self-pay | Admitting: *Deleted

## 2017-06-05 NOTE — Telephone Encounter (Signed)
Pt states she had surgery 06/02/2017, and is taking 2 ibuprofen and 2 tylenol, and was fine with the pain until last night and the block wore off and she didn't sleep. Is taking 4 ibuprofen and 2 tylenol now and it is helping but she is afraid of getting addicted to the dilaudid. Pt asked when would be a good time to take the pain medication.

## 2017-06-05 NOTE — Telephone Encounter (Addendum)
POST OP CALL-  Called pt - patient states she was overall doing well, concerned about taking the pain meds, thinks its too strong and is concerned she will get addicted, pt is currently taking 4 Ibuprofen with 1 Tylenol about every 4-6 hours, said she has trouble sleeping with the most pain being at night, advised she could take the Ibuprofen and Tylenol through her waking hours and take a pain pill before bed and that would help her sleep. Patient said she would try this. Otherwise, felt "pretty good". First POV confirmed for Thursday

## 2017-06-05 NOTE — Telephone Encounter (Signed)
I spoke with pt and she said someone had called and she was going to take the dilaudid at night.

## 2017-06-08 ENCOUNTER — Ambulatory Visit (INDEPENDENT_AMBULATORY_CARE_PROVIDER_SITE_OTHER): Payer: Federal, State, Local not specified - PPO

## 2017-06-08 ENCOUNTER — Encounter: Payer: Self-pay | Admitting: Podiatry

## 2017-06-08 ENCOUNTER — Ambulatory Visit (INDEPENDENT_AMBULATORY_CARE_PROVIDER_SITE_OTHER): Payer: Federal, State, Local not specified - PPO | Admitting: Podiatry

## 2017-06-08 DIAGNOSIS — M2012 Hallux valgus (acquired), left foot: Secondary | ICD-10-CM

## 2017-06-08 NOTE — Progress Notes (Signed)
She presents today 1 week status post [place] bunion repair second metatarsal osteotomy fifth metatarsal osteotomy hammertoe repair #2 #3 #4 and #5 all on the left foot.  She denies fever chills nausea vomiting muscle aches pains.  She denies chest pain shortness of breath calf pain.  She states that she has only had to take the pain medication twice.  Objective: Vital signs are stable she is alert oriented x3.  Pulses are palpable.  Dry sterile dressing was removed demonstrates minimal edema no ecchymosis no erythema no cellulitis drainage or odor.  Incisions are intact margins are coapting.  Alignment is impeccable.  Radiographs taken today demonstrate perfect alignment of all of the capital osteotomies and screw fixation to all of the toes.  No signs of infection.  Assessment: Well-healing surgical foot left.  Plan: Redressed today dressed a compressive dressing encouraged range of motion of her toes placed her back in her Cam walker will follow up with her in 1 week

## 2017-06-09 ENCOUNTER — Telehealth: Payer: Self-pay | Admitting: Podiatry

## 2017-06-09 NOTE — Telephone Encounter (Signed)
I had surgery with Dr. Al CorpusHyatt. Just wondered I have this tingling going on and it just feels like its asleep and its mostly when I have it like in my boot when its sort of erect, you know propped up. I was wondering if that is normal just because of the position. Then I wondered if I could leave the boot off when I'm sitting up so I can have some range of motion in my foot. I obviously wear it when I'm sleeping and when I'm up and about. I just wondered if I could keep it off while laying around. If you would call me back at [phone]. Thanks.

## 2017-06-09 NOTE — Telephone Encounter (Signed)
I informed pt the dressing may be too tight, to remove the boot, open-ended sock, ace wrap only, elevate the foot for 15 minutes, but if the pain worsened dangle for 15 minutes this being the only time it was okay to dangle the foot, after 15 minutes put foot level with the hip and rewrap the ace beginning at the toes looser going up the leg. I told pt she must still be in the boot to sleep and walk but if resting could take off. Pt states understanding.

## 2017-06-15 ENCOUNTER — Ambulatory Visit (INDEPENDENT_AMBULATORY_CARE_PROVIDER_SITE_OTHER): Payer: Federal, State, Local not specified - PPO | Admitting: Podiatry

## 2017-06-15 DIAGNOSIS — M2012 Hallux valgus (acquired), left foot: Secondary | ICD-10-CM | POA: Diagnosis not present

## 2017-06-15 DIAGNOSIS — M2042 Other hammer toe(s) (acquired), left foot: Secondary | ICD-10-CM

## 2017-06-15 NOTE — Progress Notes (Signed)
She presents today for follow-up of her bunionectomy metatarsal osteotomy #2 #5 and hammertoe repairs #2 #3 #5 and #4 of the left foot.  She states that the pain is tolerable but she did experience an increase in pain over the last several nights.  Objective: Vital signs are stable she is alert and oriented x3.  Pulses are palpable.  Sutures are intact but are not ready to be removed as of yet.  She has good range of motion of the toes though she does have some dorsiflexion at the level of the second metatarsal phalangeal joint.  This time may need physical therapy to help bring it down or digital splint.  Assessment: Well-healing surgical foot.  Plan: Follow-up with her in 1 week redressed today with a plantar flexion of the second metatarsal phalangeal joint and second toe she is continue to utilize her Cam walker and I also will follow up with her in 1 week for suture removal.

## 2017-06-23 ENCOUNTER — Ambulatory Visit (INDEPENDENT_AMBULATORY_CARE_PROVIDER_SITE_OTHER): Payer: Federal, State, Local not specified - PPO

## 2017-06-23 DIAGNOSIS — M2012 Hallux valgus (acquired), left foot: Secondary | ICD-10-CM | POA: Diagnosis not present

## 2017-06-23 DIAGNOSIS — M2042 Other hammer toe(s) (acquired), left foot: Secondary | ICD-10-CM

## 2017-06-23 NOTE — Progress Notes (Addendum)
She presents today for follow-up of her bunionectomy metatarsal osteotomy #2 #5 and hammertoe repairs #2 #3 #5 and #4 of the left foot DOS 06/02/17. She states that she still has some shooting type pains in her toes from time to time but her pain is improving.   Noted well healing surgical incisions. No erythema, drainage or swelling. Toes remained aligned.   All sutures were removed. Edges aligned and approximated, no gapping noted. Wrapped 2,3,4 toes with coban and instructed patient to do so everyday. She is allowed to get her foot wet 24 hours from now. She is to use caution when in the shower with no protection on her foot. She is to remain in her boot at all times, she can try to use her post op shoe some at home.   Darco shoe, ace bandage and compression anklet dispensed. Follow up in 2 weeks with Dr Al CorpusHyatt

## 2017-07-06 ENCOUNTER — Ambulatory Visit: Payer: Federal, State, Local not specified - PPO

## 2017-07-06 ENCOUNTER — Encounter: Payer: Federal, State, Local not specified - PPO | Admitting: Podiatry

## 2017-07-06 DIAGNOSIS — M2012 Hallux valgus (acquired), left foot: Secondary | ICD-10-CM

## 2017-07-06 DIAGNOSIS — M2042 Other hammer toe(s) (acquired), left foot: Secondary | ICD-10-CM

## 2017-07-06 DIAGNOSIS — M216X2 Other acquired deformities of left foot: Secondary | ICD-10-CM

## 2017-07-06 NOTE — Progress Notes (Signed)
This encounter was created in error - please disregard.

## 2017-07-07 ENCOUNTER — Ambulatory Visit (INDEPENDENT_AMBULATORY_CARE_PROVIDER_SITE_OTHER): Payer: Federal, State, Local not specified - PPO

## 2017-07-07 ENCOUNTER — Ambulatory Visit (INDEPENDENT_AMBULATORY_CARE_PROVIDER_SITE_OTHER): Payer: Federal, State, Local not specified - PPO | Admitting: Podiatry

## 2017-07-07 DIAGNOSIS — M2012 Hallux valgus (acquired), left foot: Secondary | ICD-10-CM

## 2017-07-07 DIAGNOSIS — M2042 Other hammer toe(s) (acquired), left foot: Secondary | ICD-10-CM

## 2017-07-07 NOTE — Progress Notes (Signed)
Subjective:   Patient ID: Wendy Shelton, female   DOB: 47 y.o.   MRN: [id number]   HPI Patient states doing well overall with her foot but she is getting antsy and is frustrated by that healing process   ROS      Objective:  Physical Exam  Neurovascular status intact negative Homans sign noted with all incision site healing well with wound edges well coapted good alignment of the lesser digits and good range of motion first MPJ     Assessment:  Doing well post forefoot reconstruction left by Dr. Al CorpusHyatt     Plan:  Instructed on continued compression elevation and dispensed a brace in order to plantarflex the second third and fourth toes with instructions given on how to do it.  Begin physical therapy at this time and reappoint with Dr. Sheppard PlumberHyde to reevaluate in 2 weeks  X-rays dated today indicating the osteotomies are healing well with all fixation in place in good alignment noted.  There is very slight elevation of the first metatarsal segment but it appears to be stable and should not move from this position

## 2017-07-10 DIAGNOSIS — M9903 Segmental and somatic dysfunction of lumbar region: Secondary | ICD-10-CM | POA: Diagnosis not present

## 2017-07-10 DIAGNOSIS — M9905 Segmental and somatic dysfunction of pelvic region: Secondary | ICD-10-CM | POA: Diagnosis not present

## 2017-07-10 DIAGNOSIS — M5442 Lumbago with sciatica, left side: Secondary | ICD-10-CM | POA: Diagnosis not present

## 2017-07-10 DIAGNOSIS — M5137 Other intervertebral disc degeneration, lumbosacral region: Secondary | ICD-10-CM | POA: Diagnosis not present

## 2017-07-12 DIAGNOSIS — M5137 Other intervertebral disc degeneration, lumbosacral region: Secondary | ICD-10-CM | POA: Diagnosis not present

## 2017-07-12 DIAGNOSIS — M9903 Segmental and somatic dysfunction of lumbar region: Secondary | ICD-10-CM | POA: Diagnosis not present

## 2017-07-12 DIAGNOSIS — M9905 Segmental and somatic dysfunction of pelvic region: Secondary | ICD-10-CM | POA: Diagnosis not present

## 2017-07-12 DIAGNOSIS — M5442 Lumbago with sciatica, left side: Secondary | ICD-10-CM | POA: Diagnosis not present

## 2017-07-14 DIAGNOSIS — R269 Unspecified abnormalities of gait and mobility: Secondary | ICD-10-CM | POA: Diagnosis not present

## 2017-07-14 DIAGNOSIS — M79675 Pain in left toe(s): Secondary | ICD-10-CM | POA: Diagnosis not present

## 2017-07-14 DIAGNOSIS — M25475 Effusion, left foot: Secondary | ICD-10-CM | POA: Diagnosis not present

## 2017-07-14 DIAGNOSIS — M25675 Stiffness of left foot, not elsewhere classified: Secondary | ICD-10-CM | POA: Diagnosis not present

## 2017-07-17 DIAGNOSIS — M79675 Pain in left toe(s): Secondary | ICD-10-CM | POA: Diagnosis not present

## 2017-07-17 DIAGNOSIS — R269 Unspecified abnormalities of gait and mobility: Secondary | ICD-10-CM | POA: Diagnosis not present

## 2017-07-17 DIAGNOSIS — M25675 Stiffness of left foot, not elsewhere classified: Secondary | ICD-10-CM | POA: Diagnosis not present

## 2017-07-17 DIAGNOSIS — M25475 Effusion, left foot: Secondary | ICD-10-CM | POA: Diagnosis not present

## 2017-07-20 ENCOUNTER — Encounter: Payer: Self-pay | Admitting: Podiatry

## 2017-07-20 ENCOUNTER — Ambulatory Visit (INDEPENDENT_AMBULATORY_CARE_PROVIDER_SITE_OTHER): Payer: Federal, State, Local not specified - PPO | Admitting: Podiatry

## 2017-07-20 ENCOUNTER — Ambulatory Visit (INDEPENDENT_AMBULATORY_CARE_PROVIDER_SITE_OTHER): Payer: Federal, State, Local not specified - PPO

## 2017-07-20 DIAGNOSIS — M9905 Segmental and somatic dysfunction of pelvic region: Secondary | ICD-10-CM | POA: Diagnosis not present

## 2017-07-20 DIAGNOSIS — M2012 Hallux valgus (acquired), left foot: Secondary | ICD-10-CM

## 2017-07-20 DIAGNOSIS — M5137 Other intervertebral disc degeneration, lumbosacral region: Secondary | ICD-10-CM | POA: Diagnosis not present

## 2017-07-20 DIAGNOSIS — M2042 Other hammer toe(s) (acquired), left foot: Secondary | ICD-10-CM

## 2017-07-20 DIAGNOSIS — M9903 Segmental and somatic dysfunction of lumbar region: Secondary | ICD-10-CM | POA: Diagnosis not present

## 2017-07-20 DIAGNOSIS — M5442 Lumbago with sciatica, left side: Secondary | ICD-10-CM | POA: Diagnosis not present

## 2017-07-20 NOTE — Progress Notes (Signed)
She presents today for follow-up of her surgical foot.  Left foot was surgically corrected June 02, 2017 consisting of her first and fifth metatarsal osteotomy hammertoe repair #2 #3 #4 5 with screws.  She states that she is doing very well she is in the third and fourth toe seems to hurt a little bit with irritation.  Objective: Vital signs are stable she is alert and oriented x3 no erythema edema cellulitis drainage or odor denies chest pain shortness of breath.  There is no erythematous mild edema no cellulitis drainage or odor.  She will continue physical therapy.  Radiographs confirm well healing osteotomies and screw fixation.  Assessment: Well-healing surgical foot.  Plan: We will allow her to get back into regular shoe over the next 2-3 weeks and I will follow-up with her in 1 month for another set of x-rays.

## 2017-07-21 DIAGNOSIS — R269 Unspecified abnormalities of gait and mobility: Secondary | ICD-10-CM | POA: Diagnosis not present

## 2017-07-21 DIAGNOSIS — M25675 Stiffness of left foot, not elsewhere classified: Secondary | ICD-10-CM | POA: Diagnosis not present

## 2017-07-21 DIAGNOSIS — M25475 Effusion, left foot: Secondary | ICD-10-CM | POA: Diagnosis not present

## 2017-07-21 DIAGNOSIS — M79675 Pain in left toe(s): Secondary | ICD-10-CM | POA: Diagnosis not present

## 2017-07-24 DIAGNOSIS — M79675 Pain in left toe(s): Secondary | ICD-10-CM | POA: Diagnosis not present

## 2017-07-24 DIAGNOSIS — M25675 Stiffness of left foot, not elsewhere classified: Secondary | ICD-10-CM | POA: Diagnosis not present

## 2017-07-24 DIAGNOSIS — R269 Unspecified abnormalities of gait and mobility: Secondary | ICD-10-CM | POA: Diagnosis not present

## 2017-07-24 DIAGNOSIS — M25475 Effusion, left foot: Secondary | ICD-10-CM | POA: Diagnosis not present

## 2017-07-26 DIAGNOSIS — M79675 Pain in left toe(s): Secondary | ICD-10-CM | POA: Diagnosis not present

## 2017-07-26 DIAGNOSIS — M25675 Stiffness of left foot, not elsewhere classified: Secondary | ICD-10-CM | POA: Diagnosis not present

## 2017-07-26 DIAGNOSIS — M25475 Effusion, left foot: Secondary | ICD-10-CM | POA: Diagnosis not present

## 2017-07-26 DIAGNOSIS — R269 Unspecified abnormalities of gait and mobility: Secondary | ICD-10-CM | POA: Diagnosis not present

## 2017-07-27 ENCOUNTER — Telehealth: Payer: Self-pay | Admitting: Podiatry

## 2017-07-27 NOTE — Telephone Encounter (Signed)
I'm annoyed at my 4th toe. My husband and I are looking at it and its curved a little. It was curved before. I put a shoe on and its just very tender to the touch like the toenail, that part of the area. When I put my gym shoe on which I don't wear all day, like in hour into wearing it is rubs the seam. I've tried a different shoe and it still hits the top of the shoe. It lays down better than my 2nd toe but maybe its because its still swollen a little and its not laying down, I kind of have to push it down a little. I just wanted to know if this will get better? My number is [phone]. Thanks.

## 2017-07-27 NOTE — Telephone Encounter (Signed)
Pt called states I gave her my line instead of the appt line.

## 2017-07-27 NOTE — Telephone Encounter (Signed)
I left message informing pt of the appt line.

## 2017-07-27 NOTE — Telephone Encounter (Signed)
Pt may have an ingrown toenail. Left message for pt to make an appt earlier than her 08/17/2017.

## 2017-07-31 DIAGNOSIS — M25675 Stiffness of left foot, not elsewhere classified: Secondary | ICD-10-CM | POA: Diagnosis not present

## 2017-07-31 DIAGNOSIS — M79675 Pain in left toe(s): Secondary | ICD-10-CM | POA: Diagnosis not present

## 2017-07-31 DIAGNOSIS — M25475 Effusion, left foot: Secondary | ICD-10-CM | POA: Diagnosis not present

## 2017-07-31 DIAGNOSIS — R269 Unspecified abnormalities of gait and mobility: Secondary | ICD-10-CM | POA: Diagnosis not present

## 2017-08-01 ENCOUNTER — Encounter: Payer: Self-pay | Admitting: Podiatry

## 2017-08-01 ENCOUNTER — Ambulatory Visit (INDEPENDENT_AMBULATORY_CARE_PROVIDER_SITE_OTHER): Payer: Federal, State, Local not specified - PPO | Admitting: Podiatry

## 2017-08-01 DIAGNOSIS — M2012 Hallux valgus (acquired), left foot: Secondary | ICD-10-CM

## 2017-08-01 DIAGNOSIS — M2042 Other hammer toe(s) (acquired), left foot: Secondary | ICD-10-CM

## 2017-08-01 NOTE — Progress Notes (Signed)
She presents today concerned about her Wendy Shelton bunion repair fifth metatarsal osteotomy second and third metatarsal osteotomy with hammertoe repair #2 #3 #4 and #5.  She states that the fourth toe still bothers me a little bit and I am concerned that the toes are not sitting down all the way.  Objective: Vital signs are stable alert oriented x3.  Pulses are palpable.  Neurologic sensorium is intact deep tendon reflexes are intact.  Upon evaluation of the foot is still quite edematous but her range of motion is improving considerably.  Fourth toe of the left foot does demonstrate some redness around the nail and some blanching of the nail bed on palpation.  This is more than likely the head of the screw this resulting in this discoloration.  Assessment: Slowly healing surgical foot left.  Plan: Discussed etiology pathology conservative versus surgical therapies.  Recommended that she try to get back into regular shoe.  Recommended that we may have to go to work on the toe and remove the screw at some point she understands this is not very happy about it but understands that.  I will follow-up with her in about 4 weeks should she have questions or concerns she will notify us immediately.

## 2017-08-02 DIAGNOSIS — M25475 Effusion, left foot: Secondary | ICD-10-CM | POA: Diagnosis not present

## 2017-08-02 DIAGNOSIS — M79675 Pain in left toe(s): Secondary | ICD-10-CM | POA: Diagnosis not present

## 2017-08-02 DIAGNOSIS — M25675 Stiffness of left foot, not elsewhere classified: Secondary | ICD-10-CM | POA: Diagnosis not present

## 2017-08-02 DIAGNOSIS — R269 Unspecified abnormalities of gait and mobility: Secondary | ICD-10-CM | POA: Diagnosis not present

## 2017-08-07 DIAGNOSIS — M79675 Pain in left toe(s): Secondary | ICD-10-CM | POA: Diagnosis not present

## 2017-08-07 DIAGNOSIS — M25475 Effusion, left foot: Secondary | ICD-10-CM | POA: Diagnosis not present

## 2017-08-07 DIAGNOSIS — M25675 Stiffness of left foot, not elsewhere classified: Secondary | ICD-10-CM | POA: Diagnosis not present

## 2017-08-07 DIAGNOSIS — R269 Unspecified abnormalities of gait and mobility: Secondary | ICD-10-CM | POA: Diagnosis not present

## 2017-08-16 DIAGNOSIS — M25475 Effusion, left foot: Secondary | ICD-10-CM | POA: Diagnosis not present

## 2017-08-16 DIAGNOSIS — R269 Unspecified abnormalities of gait and mobility: Secondary | ICD-10-CM | POA: Diagnosis not present

## 2017-08-16 DIAGNOSIS — M79675 Pain in left toe(s): Secondary | ICD-10-CM | POA: Diagnosis not present

## 2017-08-16 DIAGNOSIS — M25675 Stiffness of left foot, not elsewhere classified: Secondary | ICD-10-CM | POA: Diagnosis not present

## 2017-08-17 ENCOUNTER — Ambulatory Visit (INDEPENDENT_AMBULATORY_CARE_PROVIDER_SITE_OTHER): Payer: Federal, State, Local not specified - PPO | Admitting: Podiatry

## 2017-08-17 ENCOUNTER — Ambulatory Visit (INDEPENDENT_AMBULATORY_CARE_PROVIDER_SITE_OTHER): Payer: Federal, State, Local not specified - PPO

## 2017-08-17 ENCOUNTER — Encounter: Payer: Self-pay | Admitting: Podiatry

## 2017-08-17 DIAGNOSIS — M2012 Hallux valgus (acquired), left foot: Secondary | ICD-10-CM

## 2017-08-17 DIAGNOSIS — M2042 Other hammer toe(s) (acquired), left foot: Secondary | ICD-10-CM | POA: Diagnosis not present

## 2017-08-18 DIAGNOSIS — E559 Vitamin D deficiency, unspecified: Secondary | ICD-10-CM | POA: Diagnosis not present

## 2017-08-18 DIAGNOSIS — R5383 Other fatigue: Secondary | ICD-10-CM | POA: Diagnosis not present

## 2017-08-18 DIAGNOSIS — R635 Abnormal weight gain: Secondary | ICD-10-CM | POA: Diagnosis not present

## 2017-08-19 NOTE — Progress Notes (Signed)
She presents today for follow-up date of surgery June 02, 2017 status post hammertoe repair #2 #3 #4 and #5 left second metatarsal osteotomy left she states that it hurts a little bit in the little toe other than that is doing much better.  Objective: Vital signs are stable she is alert and oriented x3 she has great range of motion of the first metatarsal phalangeal joint after her bunion repair.  Pulses remain strong and palpable toes are gone on to heal uneventfully there a little bit swollen but incision sites are doing well.  Radiographs confirm well healing osteotomies first second fifth hammertoe repairs are intact internal fixation is intact.  Assessment: Well-healing surgical foot.  Plan: Recommend that she get back to her regular routine follow-up with me in another set of x-rays in 1 month if necessary.

## 2017-08-21 DIAGNOSIS — R269 Unspecified abnormalities of gait and mobility: Secondary | ICD-10-CM | POA: Diagnosis not present

## 2017-08-21 DIAGNOSIS — M79675 Pain in left toe(s): Secondary | ICD-10-CM | POA: Diagnosis not present

## 2017-08-21 DIAGNOSIS — M25675 Stiffness of left foot, not elsewhere classified: Secondary | ICD-10-CM | POA: Diagnosis not present

## 2017-08-21 DIAGNOSIS — M25475 Effusion, left foot: Secondary | ICD-10-CM | POA: Diagnosis not present

## 2017-08-28 DIAGNOSIS — Z79899 Other long term (current) drug therapy: Secondary | ICD-10-CM | POA: Diagnosis not present

## 2017-08-28 DIAGNOSIS — F902 Attention-deficit hyperactivity disorder, combined type: Secondary | ICD-10-CM | POA: Diagnosis not present

## 2017-08-28 DIAGNOSIS — F419 Anxiety disorder, unspecified: Secondary | ICD-10-CM | POA: Diagnosis not present

## 2017-08-29 ENCOUNTER — Ambulatory Visit: Payer: Federal, State, Local not specified - PPO | Admitting: Allergy and Immunology

## 2017-09-05 ENCOUNTER — Encounter: Payer: Self-pay | Admitting: Allergy and Immunology

## 2017-09-05 ENCOUNTER — Ambulatory Visit: Payer: Federal, State, Local not specified - PPO | Admitting: Allergy and Immunology

## 2017-09-05 VITALS — BP 120/78 | HR 68 | Temp 98.3°F | Resp 16

## 2017-09-05 DIAGNOSIS — K219 Gastro-esophageal reflux disease without esophagitis: Secondary | ICD-10-CM

## 2017-09-05 DIAGNOSIS — J4520 Mild intermittent asthma, uncomplicated: Secondary | ICD-10-CM | POA: Diagnosis not present

## 2017-09-05 DIAGNOSIS — J3089 Other allergic rhinitis: Secondary | ICD-10-CM | POA: Diagnosis not present

## 2017-09-05 MED ORDER — CARBINOXAMINE MALEATE 4 MG PO TABS: 4.0000 mg | ORAL_TABLET | Freq: Three times a day (TID) | ORAL | 5 refills | Status: DC | PRN

## 2017-09-05 MED ORDER — RANITIDINE HCL 150 MG PO TABS: 150.0000 mg | ORAL_TABLET | Freq: Two times a day (BID) | ORAL | 5 refills | Status: DC | PRN

## 2017-09-05 MED ORDER — MONTELUKAST SODIUM 10 MG PO TABS
10.0000 mg | ORAL_TABLET | Freq: Every day | ORAL | 5 refills | Status: DC
Start: 2017-09-05 — End: 2018-03-18

## 2017-09-05 NOTE — Assessment & Plan Note (Addendum)
Currently with suboptimal control.  Continue appropriate allergen avoidance measures.  A prescription has been provided for montelukast 10 mg daily at bedtime.  A prescription has been provided for carbinoxamine maleate 4 mg every 6-8 hours as needed.  For now, continue Xhance, 2 sprays per nostril 1-2 times daily as needed  Continue nasal saline irrigation as needed and prior to Dymista.  Discontinue loratadine.  The risks and benefits of aeroallergen immunotherapy have been discussed. The patient is motivated to initiate immunotherapy if insurance coverage is favorable. He/She will let us know how he/she would like to proceed.

## 2017-09-05 NOTE — Assessment & Plan Note (Signed)
   Montelukast has been prescribed (as above).  Continue albuterol HFA, 1-2 inhalations every 6 hours if needed.  Subjective and objective measures of pulmonary function will be followed and the treatment plan will be adjusted accordingly.

## 2017-09-05 NOTE — Patient Instructions (Addendum)
Perennial and seasonal allergic rhinitis Currently with suboptimal control.  Continue appropriate allergen avoidance measures.  A prescription has been provided for montelukast 10 mg daily at bedtime.  A prescription has been provided for carbinoxamine maleate 4 mg every 6-8 hours as needed.  For now, continue Xhance, 2 sprays per nostril 1-2 times daily as needed  Continue nasal saline irrigation as needed and prior to Dymista.  Discontinue loratadine.  The risks and benefits of aeroallergen immunotherapy have been discussed. The patient is motivated to initiate immunotherapy if insurance coverage is favorable. He/She will let us know how he/she would like to proceed.  Mild intermittent asthma  Montelukast has been prescribed (as above).  Continue albuterol HFA, 1-2 inhalations every 6 hours if needed.  Subjective and objective measures of pulmonary function will be followed and the treatment plan will be adjusted accordingly.  Acid reflux  Appropriate reflux lifestyle modifications have been provided and discussed.  Ranitidine (Zantac) 150 mg twice daily as needed.   Return in about 6 months (around 03/07/2018), or if symptoms worsen or fail to improve.   Lifestyle Changes for Controlling GERD  When you have GERD, stomach acid feels as if it's backing up toward your mouth. Whether or not you take medication to control your GERD, your symptoms can often be improved with lifestyle changes.   Raise Your Head  Reflux is more likely to strike when you're lying down flat, because stomach fluid can  flow backward more easily. Raising the head of your bed 4-6 inches can help. To do this:  Slide blocks or books under the legs at the head of your bed. Or, place a wedge under  the mattress. Many foam stores can make a suitable wedge for you. The wedge  should run from your waist to the top of your head.  Don't just prop your head on several pillows. This increases pressure  on your  stomach. It can make GERD worse.  Watch Your Eating Habits Certain foods may increase the acid in your stomach or relax the lower esophageal sphincter, making GERD more likely. It's best to avoid the following:  Coffee, tea, and carbonated drinks (with and without caffeine)  Fatty, fried, or spicy food  Mint, chocolate, onions, and tomatoes  Any other foods that seem to irritate your stomach or cause you pain  Relieve the Pressure  Eat smaller meals, even if you have to eat more often.  Don't lie down right after you eat. Wait a few hours for your stomach to empty.  Avoid tight belts and tight-fitting clothes.  Lose excess weight.  Tobacco and Alcohol  Avoid smoking tobacco and drinking alcohol. They can make GERD symptoms worse.

## 2017-09-05 NOTE — Assessment & Plan Note (Signed)
   Appropriate reflux lifestyle modifications have been provided and discussed.  Ranitidine (Zantac) 150 mg twice daily as needed.

## 2017-09-05 NOTE — Progress Notes (Signed)
Follow-up Note  RE: Wendy Shelton MRN: [id number] DOB: November 21, 1970 Date of Office Visit: 09/05/2017  Primary care provider: Lorenda Ishihara, MD Referring provider: Lorenda Shelton,*  History of present illness: Wendy Shelton" Wendy Shelton is a 47 y.o. female with intermittent asthma, allergic rhinitis, and history of food allergy presenting today for a sick visit.  She was previously seen in this clinic in October 2018.  She reports that she lost her voice approximately 2 weeks ago.  In addition, she had been experiencing nasal congestion, sinus pressure, postnasal drainage, and ear pressure.  She went to her primary care physician's office and was prescribed azithromycin some benefit.  She notes that she has been experiencing occasional mild chest tightness, dyspnea, and wheezing she believes that the sinus infection and cough "went into (her) chest".  She also notes that around time of the onset of hoarseness she had leaned over and felt a burning sensation in her upper chest and at the base of her throat, and vomited a little in her mouth.   Assessment and plan: Perennial and seasonal allergic rhinitis Currently with suboptimal control.  Continue appropriate allergen avoidance measures.  A prescription has been provided for montelukast 10 mg daily at bedtime.  A prescription has been provided for carbinoxamine maleate 4 mg every 6-8 hours as needed.  For now, continue Xhance, 2 sprays per nostril 1-2 times daily as needed  Continue nasal saline irrigation as needed and prior to Dymista.  Discontinue loratadine.  The risks and benefits of aeroallergen immunotherapy have been discussed. The patient is motivated to initiate immunotherapy if insurance coverage is favorable. He/She will let us know how he/she would like to proceed.  Mild intermittent asthma  Montelukast has been prescribed (as above).  Continue albuterol HFA, 1-2 inhalations every 6  hours if needed.  Subjective and objective measures of pulmonary function will be followed and the treatment plan will be adjusted accordingly.  Acid reflux  Appropriate reflux lifestyle modifications have been provided and discussed.  Ranitidine (Zantac) 150 mg twice daily as needed.   Meds ordered this encounter  Medications  . montelukast (SINGULAIR) 10 MG tablet    Sig: Take 1 tablet (10 mg total) by mouth at bedtime.    Dispense:  30 tablet    Refill:  5  . Carbinoxamine Maleate 4 MG TABS    Sig: Take 1 tablet (4 mg total) by mouth every 8 (eight) hours as needed.    Dispense:  30 each    Refill:  5  . ranitidine (ZANTAC) 150 MG tablet    Sig: Take 1 tablet (150 mg total) by mouth 2 (two) times daily as needed for heartburn.    Dispense:  60 tablet    Refill:  5    Diagnostics: Spirometry reveals an FVC of 3.31 L and an FEV1 of 2.73 L (82% predicted).  Please see scanned spirometry results for details.    Physical examination: Blood pressure 120/78, pulse 68, temperature 98.3 F (36.8 C), temperature source Oral, resp. rate 16, SpO2 96 %.  General: Alert, interactive, in no acute distress. HEENT: TMs pearly gray, turbinates moderately edematous with thick discharge, post-pharynx moderately erythematous. Neck: Supple without lymphadenopathy. Lungs: Clear to auscultation without wheezing, rhonchi or rales. CV: Normal S1, S2 without murmurs. Skin: Warm and dry, without lesions or rashes.  The following portions of the patient's history were reviewed and updated as appropriate: allergies, current medications, past family history, past medical history, past social history, past  surgical history and problem list.  Allergies as of 09/05/2017      Reactions   Penicillins Hives   Amoxicillin    rash   Keflex [cephalexin]    rash   Sulfa Antibiotics    Rash      Medication List        Accurate as of 09/05/17  5:44 PM. Always use your most recent med list.            ALPRAZolam 0.25 MG tablet Commonly known as:  XANAX Take 0.25 mg by mouth at bedtime as needed for anxiety.   Carbinoxamine Maleate 6 MG Tabs Commonly known as:  RYVENT Take 1 tablet by mouth See admin instructions. Every 6-8 hours as needed   Carbinoxamine Maleate 4 MG Tabs Take 1 tablet (4 mg total) by mouth every 8 (eight) hours as needed.   dexmethylphenidate 20 MG 24 hr capsule Commonly known as:  FOCALIN XR TK 1 C PO D   Fluticasone Propionate 93 MCG/ACT Exhu Commonly known as:  XHANCE Place 2 sprays into the nose 2 (two) times daily.   guanFACINE 1 MG tablet Commonly known as:  TENEX Take by mouth.   HYDROmorphone 4 MG tablet Commonly known as:  DILAUDID Take 1 tablet (4 mg total) by mouth every 4 (four) hours as needed for severe pain.   montelukast 10 MG tablet Commonly known as:  SINGULAIR Take 1 tablet (10 mg total) by mouth at bedtime.   PROAIR HFA 108 (90 Base) MCG/ACT inhaler Generic drug:  albuterol INL 2 PFS PO Q 6 H PRN   promethazine 25 MG tablet Commonly known as:  PHENERGAN Take 1 tablet (25 mg total) by mouth every 8 (eight) hours as needed.   ranitidine 150 MG tablet Commonly known as:  ZANTAC Take 1 tablet (150 mg total) by mouth 2 (two) times daily as needed for heartburn.   valACYclovir 1000 MG tablet Commonly known as:  VALTREX TAKE 2 TABLETS BY MOUTH 2 TIMES DAILY FOR 3-7 DAYS AS NEEDED       Allergies  Allergen Reactions  . Penicillins Hives  . Amoxicillin     rash  . Keflex [Cephalexin]     rash  . Sulfa Antibiotics     Rash    Review of systems: Review of systems negative except as noted in HPI / PMHx or noted below: Constitutional: Negative.  HENT: Negative.   Eyes: Negative.  Respiratory: Negative.   Cardiovascular: Negative.  Gastrointestinal: Negative.  Genitourinary: Negative.  Musculoskeletal: Negative.  Neurological: Negative.  Endo/Heme/Allergies: Negative.  Cutaneous: Negative.  Past Medical  History:  Diagnosis Date  . ADHD   . Anxiety   . Asthma     Family History  Problem Relation Age of Onset  . Allergic rhinitis Mother   . Asthma Brother   . Angioedema Neg Hx   . Eczema Neg Hx   . Immunodeficiency Neg Hx   . Urticaria Neg Hx     Social History   Socioeconomic History  . Marital status: Single    Spouse name: Not on file  . Number of children: Not on file  . Years of education: Not on file  . Highest education level: Not on file  Occupational History  . Not on file  Social Needs  . Financial resource strain: Not on file  . Food insecurity:    Worry: Not on file    Inability: Not on file  . Transportation needs:    Medical:  Not on file    Non-medical: Not on file  Tobacco Use  . Smoking status: Former Smoker    Packs/day: 0.25    Years: 15.00    Pack years: 3.75    Types: Cigarettes  . Smokeless tobacco: Never Used  Substance and Sexual Activity  . Alcohol use: Yes  . Drug use: No  . Sexual activity: Not on file  Lifestyle  . Physical activity:    Days per week: Not on file    Minutes per session: Not on file  . Stress: Not on file  Relationships  . Social connections:    Talks on phone: Not on file    Gets together: Not on file    Attends religious service: Not on file    Active member of club or organization: Not on file    Attends meetings of clubs or organizations: Not on file    Relationship status: Not on file  . Intimate partner violence:    Fear of current or ex partner: Not on file    Emotionally abused: Not on file    Physically abused: Not on file    Forced sexual activity: Not on file  Other Topics Concern  . Not on file  Social History Narrative  . Not on file    I appreciate the opportunity to take part in Sheray's care. Please do not hesitate to contact me with questions.  Sincerely,   R. Jorene Guest, MD

## 2017-09-14 ENCOUNTER — Telehealth: Payer: Self-pay

## 2017-09-14 NOTE — Telephone Encounter (Signed)
Try claritin or allegra during the day and ryvent at bedtime (see if she does ok with this without needing guafenesin)

## 2017-09-14 NOTE — Telephone Encounter (Signed)
Pt called states, she can't take Ryvent tid because it makes her sleepy during the day. Can she restart the Claritin that she had no problems with and take Ryvent at bedtime? Also wanted to know if taking the Ryvent at night would interfere with her guanfacine she also takes at night?

## 2017-09-15 DIAGNOSIS — F4323 Adjustment disorder with mixed anxiety and depressed mood: Secondary | ICD-10-CM | POA: Diagnosis not present

## 2017-09-15 NOTE — Telephone Encounter (Signed)
INFORMED PT  Of what dr Nunzio CobbsBobbitt stated

## 2017-09-26 ENCOUNTER — Ambulatory Visit: Payer: Federal, State, Local not specified - PPO | Admitting: Podiatry

## 2017-09-26 ENCOUNTER — Encounter: Payer: Self-pay | Admitting: Podiatry

## 2017-09-26 ENCOUNTER — Ambulatory Visit (INDEPENDENT_AMBULATORY_CARE_PROVIDER_SITE_OTHER): Payer: Federal, State, Local not specified - PPO

## 2017-09-26 DIAGNOSIS — M2042 Other hammer toe(s) (acquired), left foot: Secondary | ICD-10-CM

## 2017-09-26 NOTE — Progress Notes (Signed)
She states that she is doing quite well she refers to her surgery was performed June 02, 2017.  [place] bunion repair with hammertoe repairs #2 #3 #4 #5 with second and fifth metatarsal osteotomies.  She states that she feels a bit of a clicking and still gets some swelling in the forefoot.  Objective: Vital signs are stable alert oriented x3.  Pulses are palpable.  Surgical foot is doing great she is got better range of motion than I have seen her with since surgery.  There is still some mild adhesions underneath the incision sites.  There is some mild edema in the ankle but the forefoot appears to be healing quite nicely.  She still has some swelling to get down but radiographically all of her osteotomies have gone on to heal uneventfully.  Assessment: Flexible pes planus bilaterally.  Well-healing surgical foot left.  Plan: Consider removal of internal fixation to the metatarsals to help prevent the clicking and also want to get her into a pair of orthotics once her swelling has gone down.

## 2017-09-29 DIAGNOSIS — E669 Obesity, unspecified: Secondary | ICD-10-CM | POA: Diagnosis not present

## 2017-09-29 DIAGNOSIS — Z0000 Encounter for general adult medical examination without abnormal findings: Secondary | ICD-10-CM | POA: Diagnosis not present

## 2017-09-29 DIAGNOSIS — E559 Vitamin D deficiency, unspecified: Secondary | ICD-10-CM | POA: Diagnosis not present

## 2017-09-29 DIAGNOSIS — Z1231 Encounter for screening mammogram for malignant neoplasm of breast: Secondary | ICD-10-CM | POA: Diagnosis not present

## 2017-10-06 DIAGNOSIS — F4323 Adjustment disorder with mixed anxiety and depressed mood: Secondary | ICD-10-CM | POA: Diagnosis not present

## 2017-10-12 DIAGNOSIS — N760 Acute vaginitis: Secondary | ICD-10-CM | POA: Diagnosis not present

## 2017-10-12 DIAGNOSIS — Z01411 Encounter for gynecological examination (general) (routine) with abnormal findings: Secondary | ICD-10-CM | POA: Diagnosis not present

## 2017-11-16 DIAGNOSIS — Z1231 Encounter for screening mammogram for malignant neoplasm of breast: Secondary | ICD-10-CM | POA: Diagnosis not present

## 2017-11-17 DIAGNOSIS — F4323 Adjustment disorder with mixed anxiety and depressed mood: Secondary | ICD-10-CM | POA: Diagnosis not present

## 2017-11-30 DIAGNOSIS — F902 Attention-deficit hyperactivity disorder, combined type: Secondary | ICD-10-CM | POA: Diagnosis not present

## 2017-11-30 DIAGNOSIS — Z79899 Other long term (current) drug therapy: Secondary | ICD-10-CM | POA: Diagnosis not present

## 2017-11-30 DIAGNOSIS — F338 Other recurrent depressive disorders: Secondary | ICD-10-CM | POA: Diagnosis not present

## 2017-11-30 DIAGNOSIS — F419 Anxiety disorder, unspecified: Secondary | ICD-10-CM | POA: Diagnosis not present

## 2017-12-08 DIAGNOSIS — F4323 Adjustment disorder with mixed anxiety and depressed mood: Secondary | ICD-10-CM | POA: Diagnosis not present

## 2017-12-14 DIAGNOSIS — M5442 Lumbago with sciatica, left side: Secondary | ICD-10-CM | POA: Diagnosis not present

## 2017-12-14 DIAGNOSIS — M9903 Segmental and somatic dysfunction of lumbar region: Secondary | ICD-10-CM | POA: Diagnosis not present

## 2017-12-14 DIAGNOSIS — M5137 Other intervertebral disc degeneration, lumbosacral region: Secondary | ICD-10-CM | POA: Diagnosis not present

## 2017-12-14 DIAGNOSIS — M5417 Radiculopathy, lumbosacral region: Secondary | ICD-10-CM | POA: Diagnosis not present

## 2017-12-21 DIAGNOSIS — M5137 Other intervertebral disc degeneration, lumbosacral region: Secondary | ICD-10-CM | POA: Diagnosis not present

## 2017-12-21 DIAGNOSIS — M5442 Lumbago with sciatica, left side: Secondary | ICD-10-CM | POA: Diagnosis not present

## 2017-12-21 DIAGNOSIS — M9903 Segmental and somatic dysfunction of lumbar region: Secondary | ICD-10-CM | POA: Diagnosis not present

## 2017-12-21 DIAGNOSIS — M5417 Radiculopathy, lumbosacral region: Secondary | ICD-10-CM | POA: Diagnosis not present

## 2017-12-28 DIAGNOSIS — M9903 Segmental and somatic dysfunction of lumbar region: Secondary | ICD-10-CM | POA: Diagnosis not present

## 2017-12-28 DIAGNOSIS — M5137 Other intervertebral disc degeneration, lumbosacral region: Secondary | ICD-10-CM | POA: Diagnosis not present

## 2017-12-28 DIAGNOSIS — M5442 Lumbago with sciatica, left side: Secondary | ICD-10-CM | POA: Diagnosis not present

## 2017-12-28 DIAGNOSIS — M5417 Radiculopathy, lumbosacral region: Secondary | ICD-10-CM | POA: Diagnosis not present

## 2018-01-05 DIAGNOSIS — F4323 Adjustment disorder with mixed anxiety and depressed mood: Secondary | ICD-10-CM | POA: Diagnosis not present

## 2018-01-10 DIAGNOSIS — M5137 Other intervertebral disc degeneration, lumbosacral region: Secondary | ICD-10-CM | POA: Diagnosis not present

## 2018-01-10 DIAGNOSIS — M9903 Segmental and somatic dysfunction of lumbar region: Secondary | ICD-10-CM | POA: Diagnosis not present

## 2018-01-10 DIAGNOSIS — M5442 Lumbago with sciatica, left side: Secondary | ICD-10-CM | POA: Diagnosis not present

## 2018-01-10 DIAGNOSIS — M5417 Radiculopathy, lumbosacral region: Secondary | ICD-10-CM | POA: Diagnosis not present

## 2018-01-25 DIAGNOSIS — M9903 Segmental and somatic dysfunction of lumbar region: Secondary | ICD-10-CM | POA: Diagnosis not present

## 2018-01-25 DIAGNOSIS — M5417 Radiculopathy, lumbosacral region: Secondary | ICD-10-CM | POA: Diagnosis not present

## 2018-01-25 DIAGNOSIS — M5137 Other intervertebral disc degeneration, lumbosacral region: Secondary | ICD-10-CM | POA: Diagnosis not present

## 2018-01-25 DIAGNOSIS — M5442 Lumbago with sciatica, left side: Secondary | ICD-10-CM | POA: Diagnosis not present

## 2018-02-12 DIAGNOSIS — M9903 Segmental and somatic dysfunction of lumbar region: Secondary | ICD-10-CM | POA: Diagnosis not present

## 2018-02-12 DIAGNOSIS — M5417 Radiculopathy, lumbosacral region: Secondary | ICD-10-CM | POA: Diagnosis not present

## 2018-02-12 DIAGNOSIS — M5442 Lumbago with sciatica, left side: Secondary | ICD-10-CM | POA: Diagnosis not present

## 2018-02-12 DIAGNOSIS — M5137 Other intervertebral disc degeneration, lumbosacral region: Secondary | ICD-10-CM | POA: Diagnosis not present

## 2018-02-15 DIAGNOSIS — F419 Anxiety disorder, unspecified: Secondary | ICD-10-CM | POA: Diagnosis not present

## 2018-03-07 DIAGNOSIS — Z79899 Other long term (current) drug therapy: Secondary | ICD-10-CM | POA: Diagnosis not present

## 2018-03-07 DIAGNOSIS — F902 Attention-deficit hyperactivity disorder, combined type: Secondary | ICD-10-CM | POA: Diagnosis not present

## 2018-03-09 DIAGNOSIS — M5137 Other intervertebral disc degeneration, lumbosacral region: Secondary | ICD-10-CM | POA: Diagnosis not present

## 2018-03-09 DIAGNOSIS — M5417 Radiculopathy, lumbosacral region: Secondary | ICD-10-CM | POA: Diagnosis not present

## 2018-03-09 DIAGNOSIS — M5442 Lumbago with sciatica, left side: Secondary | ICD-10-CM | POA: Diagnosis not present

## 2018-03-09 DIAGNOSIS — M9903 Segmental and somatic dysfunction of lumbar region: Secondary | ICD-10-CM | POA: Diagnosis not present

## 2018-03-12 ENCOUNTER — Ambulatory Visit: Payer: Federal, State, Local not specified - PPO | Admitting: Allergy and Immunology

## 2018-03-18 ENCOUNTER — Other Ambulatory Visit: Payer: Self-pay | Admitting: Allergy and Immunology

## 2018-03-18 DIAGNOSIS — J4520 Mild intermittent asthma, uncomplicated: Secondary | ICD-10-CM

## 2018-03-18 DIAGNOSIS — J3089 Other allergic rhinitis: Secondary | ICD-10-CM

## 2018-03-23 DIAGNOSIS — M5442 Lumbago with sciatica, left side: Secondary | ICD-10-CM | POA: Diagnosis not present

## 2018-03-23 DIAGNOSIS — M5417 Radiculopathy, lumbosacral region: Secondary | ICD-10-CM | POA: Diagnosis not present

## 2018-03-23 DIAGNOSIS — M5137 Other intervertebral disc degeneration, lumbosacral region: Secondary | ICD-10-CM | POA: Diagnosis not present

## 2018-03-23 DIAGNOSIS — M9903 Segmental and somatic dysfunction of lumbar region: Secondary | ICD-10-CM | POA: Diagnosis not present

## 2018-04-06 DIAGNOSIS — M5137 Other intervertebral disc degeneration, lumbosacral region: Secondary | ICD-10-CM | POA: Diagnosis not present

## 2018-04-06 DIAGNOSIS — M5442 Lumbago with sciatica, left side: Secondary | ICD-10-CM | POA: Diagnosis not present

## 2018-04-06 DIAGNOSIS — M5417 Radiculopathy, lumbosacral region: Secondary | ICD-10-CM | POA: Diagnosis not present

## 2018-04-06 DIAGNOSIS — M9903 Segmental and somatic dysfunction of lumbar region: Secondary | ICD-10-CM | POA: Diagnosis not present

## 2018-04-23 ENCOUNTER — Ambulatory Visit: Payer: Federal, State, Local not specified - PPO | Admitting: Allergy and Immunology

## 2018-04-23 ENCOUNTER — Encounter: Payer: Self-pay | Admitting: Allergy and Immunology

## 2018-04-23 VITALS — BP 114/70 | HR 63 | Temp 97.6°F | Resp 18 | Ht 66.5 in | Wt 210.0 lb

## 2018-04-23 DIAGNOSIS — J3089 Other allergic rhinitis: Secondary | ICD-10-CM

## 2018-04-23 DIAGNOSIS — R0609 Other forms of dyspnea: Secondary | ICD-10-CM | POA: Diagnosis not present

## 2018-04-23 DIAGNOSIS — J4530 Mild persistent asthma, uncomplicated: Secondary | ICD-10-CM

## 2018-04-23 MED ORDER — ALBUTEROL SULFATE HFA 108 (90 BASE) MCG/ACT IN AERS: 1.0000 | INHALATION_SPRAY | RESPIRATORY_TRACT | 2 refills | Status: DC | PRN

## 2018-04-23 MED ORDER — FLUTICASONE PROPIONATE HFA 44 MCG/ACT IN AERO: 2.0000 | INHALATION_SPRAY | Freq: Two times a day (BID) | RESPIRATORY_TRACT | 5 refills | Status: DC

## 2018-04-23 NOTE — Assessment & Plan Note (Signed)
Currently with suboptimal control.  A prescription has been provided for Flovent (fluticasone) 44 g, 2 inhalations twice a day. To maximize pulmonary deposition, a spacer has been provided along with instructions for its proper administration with an HFA inhaler.  Continue montelukast 10 mg daily at bedtime and albuterol HFA, 1 to 2 inhalations every 4-6 hours as needed and 15 minutes prior to exercise.  The patient has been asked to contact me if her symptoms persist or progress. Otherwise, she may return for follow up in 4 months.

## 2018-04-23 NOTE — Assessment & Plan Note (Signed)
   Continue appropriate allergen avoidance measures, montelukast daily, and Xhance as needed.

## 2018-04-23 NOTE — Progress Notes (Signed)
Follow-up Note  RE: Wendy Shelton MRN: [id number] DOB: 12-25-1970 Date of Office Visit: 04/23/2018  Primary care provider: Lorenda Ishihara, MD Referring provider: Lorenda Ishihara,*  History of present illness: Wendy Shelton is a 47 y.o. female with asthma, allergic rhinitis, and acid reflux presenting today for follow-up.  She was last seen in this clinic on September 05, 2017.  She reports that her nasal allergy symptoms have been well controlled with Xhance nasal spray and nasal saline spray if needed.  She reports that despite compliance with montelukast 10 mg daily at bedtime and using albuterol prior to exercise that her exercise has been limited by asthma symptoms.  She has been experiencing asthma symptoms at rest a few times per week.  She reports that over the past week she has difficulty achieving a "deep cleansing breath."  During these episodes she has no difficulty with exhalation and denies wheezing.  He is able to eventually attain relief by a "successful" yawn but the air hunger eventually returns.  This dyspnea seems to increase with emotional stress and is inducible by suggestion.  Assessment and plan: Mild persistent asthma Currently with suboptimal control.  A prescription has been provided for Flovent (fluticasone) 44 g,  2 inhalations twice a day. To maximize pulmonary deposition, a spacer has been provided along with instructions for its proper administration with an HFA inhaler.  Continue montelukast 10 mg daily at bedtime and albuterol HFA, 1 to 2 inhalations every 4-6 hours as needed and 15 minutes prior to exercise.  The patient has been asked to contact me if her symptoms persist or progress. Otherwise, she may return for follow up in 4 months.  Other forms of dyspnea The patients sensation of air-hunger, not being able to get a full breath on inspiration, which may eventually be relieved by a yawn suggests sighing  dyspnea. Other less likely etiologies include vocal cord dysfunction and asthma. The patients spirometry today was normal.  Diaphragmatic breathing, or belly breathing, has been discussed with the patient as this technique often times relieves sighing dyspnea.  As a therapeutic trial, a refill prescription has been provided for albuterol HFA, 1 to 2 inhalations every 4-6 hours if needed.  The patient is to monitor for symptom relief, or lack thereof, from this medication.  The patients subjective and objective measures a pulmonary function will be followed and the treatment plan will be adjusted accordingly. We will recheck spirometry on the next visit.  Perennial and seasonal allergic rhinitis  Continue appropriate allergen avoidance measures, montelukast daily, and Xhance as needed.   Meds ordered this encounter  Medications  . fluticasone (FLOVENT HFA) 44 MCG/ACT inhaler    Sig: Inhale 2 puffs into the lungs 2 (two) times daily.    Dispense:  1 Inhaler    Refill:  5  . albuterol (PROVENTIL HFA;VENTOLIN HFA) 108 (90 Base) MCG/ACT inhaler    Sig: Inhale 1-2 puffs into the lungs every 4 (four) hours as needed for wheezing or shortness of breath.    Dispense:  18 g    Refill:  2    Diagnostics: Spirometry:  Normal with an FEV1 of 91% predicted.  Please see scanned spirometry results for details.    Physical examination: Blood pressure 114/70, pulse 63, temperature 97.6 F (36.4 C), temperature source Oral, resp. rate 18, height 5' 6.5" (1.689 m), weight 210 lb (95.3 kg), SpO2 96 %.  General: Alert, interactive, in no acute distress. HEENT: TMs pearly gray, turbinates  mildly edematous without discharge, post-pharynx unremarkable. Neck: Supple without lymphadenopathy. Lungs: Clear to auscultation without wheezing, rhonchi or rales. CV: Normal S1, S2 without murmurs. Skin: Warm and dry, without lesions or rashes.  The following portions of the patient's history were reviewed and  updated as appropriate: allergies, current medications, past family history, past medical history, past social history, past surgical history and problem list.  Allergies as of 04/23/2018      Reactions   Penicillins Hives   Amoxicillin    rash   Keflex [cephalexin]    rash   Sulfa Antibiotics    Rash      Medication List        Accurate as of 04/23/18  8:46 PM. Always use your most recent med list.          ALPRAZolam 0.25 MG tablet Commonly known as:  XANAX Take 0.25 mg by mouth at bedtime as needed for anxiety.   dexmethylphenidate 20 MG 24 hr capsule Commonly known as:  FOCALIN XR TK 1 C PO D   fluticasone 44 MCG/ACT inhaler Commonly known as:  FLOVENT HFA Inhale 2 puffs into the lungs 2 (two) times daily.   Fluticasone Propionate 93 MCG/ACT Exhu Place 2 sprays into the nose 2 (two) times daily.   guanFACINE 1 MG Tb24 ER tablet Commonly known as:  INTUNIV TK 1 T PO BID   montelukast 10 MG tablet Commonly known as:  SINGULAIR TAKE 1 TABLET(10 MG) BY MOUTH AT BEDTIME   PROAIR HFA 108 (90 Base) MCG/ACT inhaler Generic drug:  albuterol INL 2 PFS PO Q 6 H PRN   albuterol 108 (90 Base) MCG/ACT inhaler Commonly known as:  PROVENTIL HFA;VENTOLIN HFA Inhale 1-2 puffs into the lungs every 4 (four) hours as needed for wheezing or shortness of breath.   ranitidine 150 MG tablet Commonly known as:  ZANTAC Take 1 tablet (150 mg total) by mouth 2 (two) times daily as needed for heartburn.   valACYclovir 1000 MG tablet Commonly known as:  VALTREX TAKE 2 TABLETS BY MOUTH 2 TIMES DAILY FOR 3-7 DAYS AS NEEDED       Allergies  Allergen Reactions  . Penicillins Hives  . Amoxicillin     rash  . Keflex [Cephalexin]     rash  . Sulfa Antibiotics     Rash    Review of systems: Review of systems negative except as noted in HPI / PMHx or noted below: Constitutional: Negative.  HENT: Negative.   Eyes: Negative.  Respiratory: Negative.   Cardiovascular: Negative.    Gastrointestinal: Negative.  Genitourinary: Negative.  Musculoskeletal: Negative.  Neurological: Negative.  Endo/Heme/Allergies: Negative.  Cutaneous: Negative.  Past Medical History:  Diagnosis Date  . ADHD   . Anxiety   . Asthma     Family History  Problem Relation Age of Onset  . Allergic rhinitis Mother   . Asthma Brother   . Angioedema Neg Hx   . Eczema Neg Hx   . Immunodeficiency Neg Hx   . Urticaria Neg Hx     Social History   Socioeconomic History  . Marital status: Single    Spouse name: Not on file  . Number of children: Not on file  . Years of education: Not on file  . Highest education level: Not on file  Occupational History  . Not on file  Social Needs  . Financial resource strain: Not on file  . Food insecurity:    Worry: Not on file  Inability: Not on file  . Transportation needs:    Medical: Not on file    Non-medical: Not on file  Tobacco Use  . Smoking status: Former Smoker    Packs/day: 0.25    Years: 15.00    Pack years: 3.75    Types: Cigarettes  . Smokeless tobacco: Never Used  Substance and Sexual Activity  . Alcohol use: Yes  . Drug use: No  . Sexual activity: Not on file  Lifestyle  . Physical activity:    Days per week: Not on file    Minutes per session: Not on file  . Stress: Not on file  Relationships  . Social connections:    Talks on phone: Not on file    Gets together: Not on file    Attends religious service: Not on file    Active member of club or organization: Not on file    Attends meetings of clubs or organizations: Not on file    Relationship status: Not on file  . Intimate partner violence:    Fear of current or ex partner: Not on file    Emotionally abused: Not on file    Physically abused: Not on file    Forced sexual activity: Not on file  Other Topics Concern  . Not on file  Social History Narrative  . Not on file    I appreciate the opportunity to take part in Zahriyah's care. Please do not  hesitate to contact me with questions.  Sincerely,   R. Jorene Guest, MD

## 2018-04-23 NOTE — Assessment & Plan Note (Signed)
The patients sensation of air-hunger, not being able to get a full breath on inspiration, which may eventually be relieved by a yawn suggests sighing dyspnea. Other less likely etiologies include vocal cord dysfunction and asthma. The patients spirometry today was normal.  Diaphragmatic breathing, or belly breathing, has been discussed with the patient as this technique often times relieves sighing dyspnea.  As a therapeutic trial, a refill prescription has been provided for albuterol HFA, 1 to 2 inhalations every 4-6 hours if needed.  The patient is to monitor for symptom relief, or lack thereof, from this medication.  The patients subjective and objective measures a pulmonary function will be followed and the treatment plan will be adjusted accordingly. We will recheck spirometry on the next visit.

## 2018-04-23 NOTE — Patient Instructions (Addendum)
Mild persistent asthma Currently with suboptimal control.  A prescription has been provided for Flovent (fluticasone) 44 g,  2 inhalations twice a day. To maximize pulmonary deposition, a spacer has been provided along with instructions for its proper administration with an HFA inhaler.  Continue montelukast 10 mg daily at bedtime and albuterol HFA, 1 to 2 inhalations every 4-6 hours as needed and 15 minutes prior to exercise.  The patient has been asked to contact me if her symptoms persist or progress. Otherwise, she may return for follow up in 4 months.  Other forms of dyspnea The patients sensation of air-hunger, not being able to get a full breath on inspiration, which may eventually be relieved by a yawn suggests sighing dyspnea. Other less likely etiologies include vocal cord dysfunction and asthma. The patients spirometry today was normal.  Diaphragmatic breathing, or belly breathing, has been discussed with the patient as this technique often times relieves sighing dyspnea.  As a therapeutic trial, a refill prescription has been provided for albuterol HFA, 1 to 2 inhalations every 4-6 hours if needed.  The patient is to monitor for symptom relief, or lack thereof, from this medication.  The patients subjective and objective measures a pulmonary function will be followed and the treatment plan will be adjusted accordingly. We will recheck spirometry on the next visit.  Perennial and seasonal allergic rhinitis  Continue appropriate allergen avoidance measures, montelukast daily, and Xhance as needed.   Return in about 4 months (around 08/23/2018), or if symptoms worsen or fail to improve.

## 2018-04-24 DIAGNOSIS — M5442 Lumbago with sciatica, left side: Secondary | ICD-10-CM | POA: Diagnosis not present

## 2018-04-24 DIAGNOSIS — M5417 Radiculopathy, lumbosacral region: Secondary | ICD-10-CM | POA: Diagnosis not present

## 2018-04-24 DIAGNOSIS — M5137 Other intervertebral disc degeneration, lumbosacral region: Secondary | ICD-10-CM | POA: Diagnosis not present

## 2018-04-24 DIAGNOSIS — M9903 Segmental and somatic dysfunction of lumbar region: Secondary | ICD-10-CM | POA: Diagnosis not present

## 2018-05-19 ENCOUNTER — Other Ambulatory Visit: Payer: Self-pay | Admitting: Allergy and Immunology

## 2018-05-19 DIAGNOSIS — J4520 Mild intermittent asthma, uncomplicated: Secondary | ICD-10-CM

## 2018-05-19 DIAGNOSIS — J3089 Other allergic rhinitis: Secondary | ICD-10-CM

## 2018-06-05 DIAGNOSIS — F3281 Premenstrual dysphoric disorder: Secondary | ICD-10-CM | POA: Diagnosis not present

## 2018-06-05 DIAGNOSIS — M5137 Other intervertebral disc degeneration, lumbosacral region: Secondary | ICD-10-CM | POA: Diagnosis not present

## 2018-06-05 DIAGNOSIS — M9903 Segmental and somatic dysfunction of lumbar region: Secondary | ICD-10-CM | POA: Diagnosis not present

## 2018-06-05 DIAGNOSIS — M5442 Lumbago with sciatica, left side: Secondary | ICD-10-CM | POA: Diagnosis not present

## 2018-06-05 DIAGNOSIS — M5417 Radiculopathy, lumbosacral region: Secondary | ICD-10-CM | POA: Diagnosis not present

## 2018-06-07 ENCOUNTER — Ambulatory Visit: Payer: Federal, State, Local not specified - PPO | Admitting: Podiatry

## 2018-06-13 DIAGNOSIS — F902 Attention-deficit hyperactivity disorder, combined type: Secondary | ICD-10-CM | POA: Diagnosis not present

## 2018-06-13 DIAGNOSIS — F419 Anxiety disorder, unspecified: Secondary | ICD-10-CM | POA: Diagnosis not present

## 2018-06-13 DIAGNOSIS — Z79899 Other long term (current) drug therapy: Secondary | ICD-10-CM | POA: Diagnosis not present

## 2018-06-13 DIAGNOSIS — F338 Other recurrent depressive disorders: Secondary | ICD-10-CM | POA: Diagnosis not present

## 2018-06-14 ENCOUNTER — Encounter: Payer: Self-pay | Admitting: Podiatry

## 2018-06-14 ENCOUNTER — Ambulatory Visit (INDEPENDENT_AMBULATORY_CARE_PROVIDER_SITE_OTHER): Payer: Federal, State, Local not specified - PPO

## 2018-06-14 ENCOUNTER — Ambulatory Visit: Payer: Federal, State, Local not specified - PPO | Admitting: Podiatry

## 2018-06-14 DIAGNOSIS — M7751 Other enthesopathy of right foot: Secondary | ICD-10-CM | POA: Diagnosis not present

## 2018-06-14 DIAGNOSIS — M7752 Other enthesopathy of left foot: Secondary | ICD-10-CM

## 2018-06-14 DIAGNOSIS — T8484XA Pain due to internal orthopedic prosthetic devices, implants and grafts, initial encounter: Secondary | ICD-10-CM

## 2018-06-14 NOTE — Progress Notes (Signed)
She presents today after having not seen her since May of this past year with a chief complaint of acute getting cramps and spasms in the legs at night I have changed my diet and has improved to some degree but for the past couple of months it sort of been exquisitely painful with severe dorsiflexion of the first metatarsal phalangeal joint.  She states that she has increased her activity level her magnesium and her massaging has really not helped that much.  She is also having pain at the tip of the fourth toe of the left foot.  Objective: Vital signs are stable she is alert and oriented x3.  Pulses are palpable.  Neurologic sensorium is intact.  Deep tendon reflexes are intact and normal bilateral.  She has tenderness on the tip of the fourth toe left foot with what appears to be a dark spot beneath the thin clear nail more than likely this is related to the head of the screw in that fourth toe.  She has no reproducible tenderness on palpation of any other spots in the foot.  She has no tenderness on palpation of the anterior muscle group.  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  Radiographs taken today demonstrate internal fixation first metatarsal second metatarsal second third and fourth toes.  The toes themselves the screws appear to be slightly backed out from their initial insertion site.  So the heads are closer to the tips of the toes than previously noted.  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  However nonetheless I feel this should result in removal of internal fixation from the fifth and the second metatarsals as well as the toes.  Plan: Discussed the removal of the internal fixation.  We also discussed getting her into a pair of orthotics and we did so.   If this does not alleviate her symptoms then we will consider not only removing the internal fixation by putting her on a Flexeril medication.

## 2018-06-15 DIAGNOSIS — M5442 Lumbago with sciatica, left side: Secondary | ICD-10-CM | POA: Diagnosis not present

## 2018-06-15 DIAGNOSIS — M5417 Radiculopathy, lumbosacral region: Secondary | ICD-10-CM | POA: Diagnosis not present

## 2018-06-15 DIAGNOSIS — M5137 Other intervertebral disc degeneration, lumbosacral region: Secondary | ICD-10-CM | POA: Diagnosis not present

## 2018-06-15 DIAGNOSIS — M9903 Segmental and somatic dysfunction of lumbar region: Secondary | ICD-10-CM | POA: Diagnosis not present

## 2018-06-20 ENCOUNTER — Telehealth (INDEPENDENT_AMBULATORY_CARE_PROVIDER_SITE_OTHER): Payer: Self-pay | Admitting: Orthopaedic Surgery

## 2018-06-20 NOTE — Telephone Encounter (Signed)
Patient called to make appt w/Xu no answer-left message to call us back to make appt

## 2018-06-28 DIAGNOSIS — M5442 Lumbago with sciatica, left side: Secondary | ICD-10-CM | POA: Diagnosis not present

## 2018-06-28 DIAGNOSIS — M9903 Segmental and somatic dysfunction of lumbar region: Secondary | ICD-10-CM | POA: Diagnosis not present

## 2018-06-28 DIAGNOSIS — M5417 Radiculopathy, lumbosacral region: Secondary | ICD-10-CM | POA: Diagnosis not present

## 2018-06-28 DIAGNOSIS — M5137 Other intervertebral disc degeneration, lumbosacral region: Secondary | ICD-10-CM | POA: Diagnosis not present

## 2018-06-29 ENCOUNTER — Ambulatory Visit (INDEPENDENT_AMBULATORY_CARE_PROVIDER_SITE_OTHER): Payer: Federal, State, Local not specified - PPO | Admitting: Orthopaedic Surgery

## 2018-06-29 ENCOUNTER — Encounter (INDEPENDENT_AMBULATORY_CARE_PROVIDER_SITE_OTHER): Payer: Self-pay | Admitting: Orthopaedic Surgery

## 2018-06-29 ENCOUNTER — Ambulatory Visit (INDEPENDENT_AMBULATORY_CARE_PROVIDER_SITE_OTHER): Payer: Federal, State, Local not specified - PPO

## 2018-06-29 DIAGNOSIS — M25551 Pain in right hip: Secondary | ICD-10-CM | POA: Diagnosis not present

## 2018-06-29 DIAGNOSIS — M25562 Pain in left knee: Secondary | ICD-10-CM | POA: Diagnosis not present

## 2018-06-29 DIAGNOSIS — G8929 Other chronic pain: Secondary | ICD-10-CM

## 2018-06-29 NOTE — Progress Notes (Signed)
Office Visit Note   Patient: Wendy Shelton           Date of Birth: 1970-08-02           MRN: [id number] Visit Date: 06/29/2018              Requested by: Lorenda Ishihara, MD [address] [place], [place] [address] PCP: Lorenda Ishihara, MD   Assessment & Plan: Visit Diagnoses:  1. Chronic pain of left knee   2. 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  We discussed multiple treatment options today.  Sample of Pennsaid was provided today.  Continue with NSAIDs as needed.  I would recommend physical therapy for both issues.  I have sent her to [facility].  Overall patient symptoms are not severe enough to warrant injections yet.  She will continue to make efforts to lose weight which she has done an incredible job at.  She understands that she will likely have periodic patellofemoral problems because she has increased Q angle due to her valgus knees.  Follow-Up Instructions: Return if symptoms worsen or fail to improve.   Orders:  Orders Placed This Encounter  Procedures  . XR HIP UNILAT W OR W/O PELVIS 2-3 VIEWS RIGHT  . XR KNEE 3 VIEW LEFT   No orders of the defined types were placed in this encounter.     Procedures: No procedures performed   Clinical Data: No additional findings.   Subjective: Chief Complaint  Patient presents with  . Left Knee - Pain  . Right Hip - Pain    Wendy Shelton is a very pleasant 48 year old female who comes back to see me for her left knee and right hip pain.  I previously saw her in July 2018 for left knee pain.  Cortisone injection gave her some relief.  Recently she has been having increased left knee and right hip pain.  In regards to the left knee she states that she does endorse some swelling and weakness that is worse with squatting and bending of her knee and going up and down stairs.  She has  been exercising consistently has lost 37 pounds.  She denies any true mechanical symptoms.  She takes ibuprofen and Tylenol as needed.  Denies any numbness and tingling.  In terms of her right hip she endorses lateral right hip pain denies any groin pain.  There is been some weakness for years that comes and goes.  She states the right hip is painful to sleep on.  She denies any back pain.   Review of Systems  Constitutional: Negative.   HENT: Negative.   Eyes: Negative.   Respiratory: Negative.   Cardiovascular: Negative.   Endocrine: Negative.   Musculoskeletal: Negative.   Neurological: Negative.   Hematological: Negative.   Psychiatric/Behavioral: Negative.   All other systems reviewed and are negative.    Objective: Vital Signs: There were no vitals taken for this visit.  Physical Exam Vitals signs and nursing note reviewed.  Constitutional:      Appearance: She is well-developed.  Pulmonary:     Effort: Pulmonary effort is normal.  Skin:    General: Skin is warm.     Capillary Refill: Capillary refill takes less than 2 seconds.  Neurological:     Mental Status: She is alert and oriented to person, place, and time.  Psychiatric:  Behavior: Behavior normal.        Thought Content: Thought content normal.        Judgment: Judgment normal.     Ortho Exam Left knee exam shows a trace joint effusion.  Overall valgus alignment.  Normal range of motion.  Collaterals and cruciates are stable.  Minimal patellofemoral crepitus. Right hip exam shows full range of motion.  Negative Faber.  She is mildly tender over the trochanteric bursa and piriformis.  Negative Stinchfield sign negative logroll.  Negative FADIR. Specialty Comments:  No specialty comments available.  Imaging: Xr Hip Unilat W Or W/o Pelvis 2-3 Views Right  Result Date: 06/29/2018 No acute or structural abnormalities  Xr Knee 3 View Left  Result Date: 06/29/2018 Mild periarticular spurring of the left  knee.  No significant changes compared to the previous x-rays 2 years ago.    PMFS History: Patient Active Problem List   Diagnosis Date Noted  . Other forms of dyspnea 04/23/2018  . Acid reflux 09/05/2017  . Cough 02/28/2017  . Drug allergy, antibiotic 10/26/2016  . Perennial and seasonal allergic rhinitis 10/26/2016  . Rosacea 10/26/2016  . History of food allergy 10/26/2016  . Mild persistent asthma 10/26/2016   Past Medical History:  Diagnosis Date  . ADHD   . Anxiety   . Asthma     Family History  Problem Relation Age of Onset  . Allergic rhinitis Mother   . Asthma Brother   . Angioedema Neg Hx   . Eczema Neg Hx   . Immunodeficiency Neg Hx   . Urticaria Neg Hx     Past Surgical History:  Procedure Laterality Date  . FOOT SURGERY     hammer toe and bunion   Social History   Occupational History  . Not on file  Tobacco Use  . Smoking status: Former Smoker    Packs/day: 0.25    Years: 15.00    Pack years: 3.75    Types: Cigarettes  . Smokeless tobacco: Never Used  Substance and Sexual Activity  . Alcohol use: Yes  . Drug use: No  . Sexual activity: Not on file

## 2018-07-03 DIAGNOSIS — R5383 Other fatigue: Secondary | ICD-10-CM | POA: Diagnosis not present

## 2018-07-03 DIAGNOSIS — N951 Menopausal and female climacteric states: Secondary | ICD-10-CM | POA: Diagnosis not present

## 2018-07-04 ENCOUNTER — Telehealth (INDEPENDENT_AMBULATORY_CARE_PROVIDER_SITE_OTHER): Payer: Self-pay | Admitting: Orthopaedic Surgery

## 2018-07-04 NOTE — Telephone Encounter (Signed)
FAXED ORDER TO [phone]

## 2018-07-04 NOTE — Telephone Encounter (Signed)
[facility]  Phone # [phone]  Please info Fax # [phone]    Victorino Dike called requesting patient referral and Dr.Xu's orders. Jennifer left a VM I haven't spoken to her please call to advise.

## 2018-07-06 DIAGNOSIS — M7631 Iliotibial band syndrome, right leg: Secondary | ICD-10-CM | POA: Diagnosis not present

## 2018-07-06 DIAGNOSIS — M545 Low back pain: Secondary | ICD-10-CM | POA: Diagnosis not present

## 2018-07-06 DIAGNOSIS — M94262 Chondromalacia, left knee: Secondary | ICD-10-CM | POA: Diagnosis not present

## 2018-07-09 DIAGNOSIS — N951 Menopausal and female climacteric states: Secondary | ICD-10-CM | POA: Diagnosis not present

## 2018-07-09 DIAGNOSIS — R5383 Other fatigue: Secondary | ICD-10-CM | POA: Diagnosis not present

## 2018-07-09 DIAGNOSIS — R6882 Decreased libido: Secondary | ICD-10-CM | POA: Diagnosis not present

## 2018-07-09 DIAGNOSIS — M545 Low back pain: Secondary | ICD-10-CM | POA: Diagnosis not present

## 2018-07-09 DIAGNOSIS — M94262 Chondromalacia, left knee: Secondary | ICD-10-CM | POA: Diagnosis not present

## 2018-07-09 DIAGNOSIS — M7631 Iliotibial band syndrome, right leg: Secondary | ICD-10-CM | POA: Diagnosis not present

## 2018-07-23 DIAGNOSIS — M94262 Chondromalacia, left knee: Secondary | ICD-10-CM | POA: Diagnosis not present

## 2018-07-23 DIAGNOSIS — M7631 Iliotibial band syndrome, right leg: Secondary | ICD-10-CM | POA: Diagnosis not present

## 2018-07-23 DIAGNOSIS — M545 Low back pain: Secondary | ICD-10-CM | POA: Diagnosis not present

## 2018-08-01 DIAGNOSIS — M25562 Pain in left knee: Secondary | ICD-10-CM | POA: Diagnosis not present

## 2018-08-01 DIAGNOSIS — R2689 Other abnormalities of gait and mobility: Secondary | ICD-10-CM | POA: Diagnosis not present

## 2018-08-01 DIAGNOSIS — M25551 Pain in right hip: Secondary | ICD-10-CM | POA: Diagnosis not present

## 2018-08-01 DIAGNOSIS — M6281 Muscle weakness (generalized): Secondary | ICD-10-CM | POA: Diagnosis not present

## 2018-08-08 ENCOUNTER — Encounter (INDEPENDENT_AMBULATORY_CARE_PROVIDER_SITE_OTHER): Payer: Self-pay | Admitting: Orthopaedic Surgery

## 2018-08-08 ENCOUNTER — Ambulatory Visit (INDEPENDENT_AMBULATORY_CARE_PROVIDER_SITE_OTHER): Payer: Federal, State, Local not specified - PPO | Admitting: Orthopaedic Surgery

## 2018-08-08 ENCOUNTER — Ambulatory Visit (INDEPENDENT_AMBULATORY_CARE_PROVIDER_SITE_OTHER): Payer: Federal, State, Local not specified - PPO

## 2018-08-08 ENCOUNTER — Other Ambulatory Visit: Payer: Self-pay

## 2018-08-08 DIAGNOSIS — S80211A Abrasion, right knee, initial encounter: Secondary | ICD-10-CM

## 2018-08-08 DIAGNOSIS — M25561 Pain in right knee: Secondary | ICD-10-CM

## 2018-08-08 MED ORDER — DICLOFENAC SODIUM 2 % TD SOLN: 2.0000 g | Freq: Two times a day (BID) | TRANSDERMAL | 3 refills | Status: DC | PRN

## 2018-08-08 MED ORDER — MUPIROCIN 2 % EX OINT: 1.0000 "application " | TOPICAL_OINTMENT | Freq: Two times a day (BID) | CUTANEOUS | 2 refills | Status: DC
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
## 2018-08-10 ENCOUNTER — Ambulatory Visit (INDEPENDENT_AMBULATORY_CARE_PROVIDER_SITE_OTHER): Payer: Federal, State, Local not specified - PPO | Admitting: Orthopaedic Surgery

## 2018-08-27 ENCOUNTER — Ambulatory Visit (INDEPENDENT_AMBULATORY_CARE_PROVIDER_SITE_OTHER): Payer: Federal, State, Local not specified - PPO | Admitting: Allergy and Immunology

## 2018-08-27 ENCOUNTER — Encounter: Payer: Self-pay | Admitting: Allergy and Immunology

## 2018-08-27 DIAGNOSIS — J3089 Other allergic rhinitis: Secondary | ICD-10-CM

## 2018-08-27 DIAGNOSIS — J4520 Mild intermittent asthma, uncomplicated: Secondary | ICD-10-CM | POA: Diagnosis not present

## 2018-08-27 DIAGNOSIS — K219 Gastro-esophageal reflux disease without esophagitis: Secondary | ICD-10-CM

## 2018-08-27 DIAGNOSIS — J4540 Moderate persistent asthma, uncomplicated: Secondary | ICD-10-CM | POA: Diagnosis not present

## 2018-08-27 MED ORDER — ALBUTEROL SULFATE HFA 108 (90 BASE) MCG/ACT IN AERS: 1.0000 | INHALATION_SPRAY | RESPIRATORY_TRACT | 1 refills | Status: DC | PRN

## 2018-08-27 MED ORDER — FLUTICASONE PROPIONATE 93 MCG/ACT NA EXHU: 2.0000 | INHALANT_SUSPENSION | Freq: Two times a day (BID) | NASAL | 3 refills | Status: DC | PRN

## 2018-08-27 MED ORDER — MONTELUKAST SODIUM 10 MG PO TABS: 10.0000 mg | ORAL_TABLET | Freq: Every day | ORAL | 5 refills | Status: DC

## 2018-08-27 NOTE — Assessment & Plan Note (Addendum)
   Flovent 44 g, 2 inhalations via spacer device twice daily, montelukast 10 mg daily at bedtime and albuterol HFA, 1 to 2 inhalations every 4-6 hours as needed and 15 minutes prior to exercise.  The box warning for montelukast has been discussed.  Refill prescriptions have been provided.  The patient has been asked to contact me if her symptoms persist or progress. Otherwise, she may return for follow up in 4 months.

## 2018-08-27 NOTE — Patient Instructions (Addendum)
Moderate persistent asthma  Flovent 44 g, 2 inhalations via spacer device twice daily, montelukast 10 mg daily at bedtime and albuterol HFA, 1 to 2 inhalations every 4-6 hours as needed and 15 minutes prior to exercise.  The box warning for montelukast has been discussed.  Refill prescriptions have been provided.  The patient has been asked to contact me if her symptoms persist or progress. Otherwise, she may return for follow up in 4 months.  Perennial and seasonal allergic rhinitis  Continue appropriate allergen avoidance measures, montelukast daily, and Xhance as needed.  Nasal saline spray (i.e., Simply Saline) or nasal saline lavage (i.e., NeilMed) is recommended as needed and prior to medicated nasal sprays.  For thick post nasal drainage, add guaifenesin 1200 mg (Mucinex Maximum Strength)  twice daily as needed with adequate hydration.  If allergen avoidance measures and medications fail to adequately relieve symptoms, aeroallergen immunotherapy will be considered.  Acid reflux  Continue appropriate reflux lifestyle modifications.  Switch from ranitidine to famotidine (Pepcid) 20 mg 1-2 times daily as needed.   Return in about 3 months (around 11/26/2018), or if symptoms worsen or fail to improve.

## 2018-08-27 NOTE — Assessment & Plan Note (Signed)
   Continue appropriate reflux lifestyle modifications.  Switch from ranitidine to famotidine (Pepcid) 20 mg 1-2 times daily as needed.

## 2018-08-27 NOTE — Assessment & Plan Note (Addendum)
   Continue appropriate allergen avoidance measures, montelukast daily, and Xhance as needed.  Nasal saline spray (i.e., Simply Saline) or nasal saline lavage (i.e., NeilMed) is recommended as needed and prior to medicated nasal sprays.  For thick post nasal drainage, add guaifenesin 1200 mg (Mucinex Maximum Strength)  twice daily as needed with adequate hydration.  If allergen avoidance measures and medications fail to adequately relieve symptoms, aeroallergen immunotherapy will be considered. 

## 2018-08-27 NOTE — Progress Notes (Signed)
Follow-up Webex Note  RE: Wendy Shelton MRN: [id number] DOB: 03/26/1971 Date of Telemedicine Visit: 08/27/2018  Primary care provider: Lorenda Ishihara, MD Referring provider: Lorenda Ishihara,*  Telemedicine Follow Up Visit via WebEx: I connected with Wendy Shelton for a follow up on 08/27/18 via WebEx and verified that I am speaking with the correct person using two identifiers.   The limitations, risks, security and privacy concerns of performing an evaluation and management service by telemedicine, the availability of in person appointments, and that there may be a patient responsible charge related to this service were discussed. The patient expressed understanding and agreed to proceed.  Patient is at home.  Provider is at the the office.  Visit start time: 3:06 pm Visit end time: 3:27 pm Insurance consent/check in by: Hilda Lias Medical consent and medical assistant/nurse: Morrie Sheldon  History of present illness: Wendy Shelton is a 48 y.o. female with asthma, allergic rhinitis, and acid reflux presented today for Webex  follow-up.  She was last seen in this clinic in December 2019.  She reports that in the interval since her previous visit her asthma has been well controlled.  She typically requires albuterol rescue 2-3 times per month on average and does not experience limitations in normal daily activities or nocturnal awakenings due to lower respiratory symptoms.  She had been taking Flovent 44 g, 2 inhalations via spacer device twice daily, and montelukast 10 mg daily at bedtime.  However, she reports that she is currently only taking the montelukast.  Over the past few weeks, she has been experiencing some nasal congestion, postnasal drainage, occasional hoarseness, and throat clearing.  She admits that she has only been using the nasal saline rinse and Xhance sporadically.  She takes Mucinex as needed.  She has no acid reflux related  complaints today.  Assessment and plan: Moderate persistent asthma  Flovent 44 g, 2 inhalations via spacer device twice daily, montelukast 10 mg daily at bedtime and albuterol HFA, 1 to 2 inhalations every 4-6 hours as needed and 15 minutes prior to exercise.  The box warning for montelukast has been discussed.  Refill prescriptions have been provided.  The patient has been asked to contact me if her symptoms persist or progress. Otherwise, she may return for follow up in 4 months.  Perennial and seasonal allergic rhinitis  Continue appropriate allergen avoidance measures, montelukast daily, and Xhance as needed.  Nasal saline spray (i.e., Simply Saline) or nasal saline lavage (i.e., NeilMed) is recommended as needed and prior to medicated nasal sprays.  For thick post nasal drainage, add guaifenesin 1200 mg (Mucinex Maximum Strength)  twice daily as needed with adequate hydration.  If allergen avoidance measures and medications fail to adequately relieve symptoms, aeroallergen immunotherapy will be considered.  Acid reflux  Continue appropriate reflux lifestyle modifications.  Switch from ranitidine to famotidine (Pepcid) 20 mg 1-2 times daily as needed.   Meds ordered this encounter  Medications  . albuterol (PROVENTIL HFA;VENTOLIN HFA) 108 (90 Base) MCG/ACT inhaler    Sig: Inhale 1-2 puffs into the lungs every 4 (four) hours as needed for wheezing or shortness of breath.    Dispense:  18 g    Refill:  1  . Fluticasone Propionate (XHANCE) 93 MCG/ACT EXHU    Sig: Place 2 sprays into the nose 2 (two) times daily as needed.    Dispense:  16 mL    Refill:  3    [phone]  . montelukast (SINGULAIR) 10 MG tablet  Sig: Take 1 tablet (10 mg total) by mouth at bedtime.    Dispense:  30 tablet    Refill:  5    Diagnostics: None.  Physical examination: Physical Exam Not obtained as encounter was done via WebEx.  Previous notes and tests were reviewed.  The  following portions of the patient's history were reviewed and updated as appropriate: allergies, current medications, past family history, past medical history, past social history, past surgical history and problem list.  Allergies as of 08/27/2018      Reactions   Penicillins Hives   Amoxicillin    rash   Keflex [cephalexin]    rash   Sulfa Antibiotics    Rash      Medication List       Accurate as of August 27, 2018  5:01 PM. Always use your most recent med list.        albuterol 108 (90 Base) MCG/ACT inhaler Commonly known as:  PROVENTIL HFA;VENTOLIN HFA Inhale 1-2 puffs into the lungs every 4 (four) hours as needed for wheezing or shortness of breath.   dexmethylphenidate 20 MG 24 hr capsule Commonly known as:  FOCALIN XR TK 1 C PO D   Diclofenac Sodium 2 % Soln Commonly known as:  Pennsaid Apply 2 g topically 2 (two) times daily as needed (to affected area).   Fluticasone Propionate 93 MCG/ACT Exhu Commonly known as:  Xhance Place 2 sprays into the nose 2 (two) times daily as needed.   guanFACINE 1 MG Tb24 ER tablet Commonly known as:  INTUNIV TK 1 T PO BID   montelukast 10 MG tablet Commonly known as:  SINGULAIR Take 1 tablet (10 mg total) by mouth at bedtime.   mupirocin ointment 2 % Commonly known as:  Bactroban Apply 1 application topically 2 (two) times daily.   valACYclovir 1000 MG tablet Commonly known as:  VALTREX TAKE 2 TABLETS BY MOUTH 2 TIMES DAILY FOR 3-7 DAYS AS NEEDED       Allergies  Allergen Reactions  . Penicillins Hives  . Amoxicillin     rash  . Keflex [Cephalexin]     rash  . Sulfa Antibiotics     Rash     Review of systems: Review of systems negative except as noted in HPI / PMHx or noted below: Constitutional: Negative.  HENT: Negative.   Eyes: Negative.  Respiratory: Negative.   Cardiovascular: Negative.  Gastrointestinal: Negative.  Genitourinary: Negative.  Musculoskeletal: Negative.  Neurological: Negative.   Endo/Heme/Allergies: Negative.  Cutaneous: Negative.  Past Medical History:  Diagnosis Date  . ADHD   . Anxiety   . Asthma     Family History  Problem Relation Age of Onset  . Allergic rhinitis Mother   . Asthma Brother   . Angioedema Neg Hx   . Eczema Neg Hx   . Immunodeficiency Neg Hx   . Urticaria Neg Hx     Social History   Socioeconomic History  . Marital status: Single    Spouse name: Not on file  . Number of children: Not on file  . Years of education: Not on file  . Highest education level: Not on file  Occupational History  . Not on file  Social Needs  . Financial resource Shelton: Not on file  . Food insecurity:    Worry: Not on file    Inability: Not on file  . Transportation needs:    Medical: Not on file    Non-medical: Not on file  Tobacco Use  . Smoking status: Former Smoker    Packs/day: 0.25    Years: 15.00    Pack years: 3.75    Types: Cigarettes  . Smokeless tobacco: Never Used  Substance and Sexual Activity  . Alcohol use: Yes  . Drug use: No  . Sexual activity: Not on file  Lifestyle  . Physical activity:    Days per week: Not on file    Minutes per session: Not on file  . Stress: Not on file  Relationships  . Social connections:    Talks on phone: Not on file    Gets together: Not on file    Attends religious service: Not on file    Active member of club or organization: Not on file    Attends meetings of clubs or organizations: Not on file    Relationship status: Not on file  . Intimate partner violence:    Fear of current or ex partner: Not on file    Emotionally abused: Not on file    Physically abused: Not on file    Forced sexual activity: Not on file  Other Topics Concern  . Not on file  Social History Narrative  . Not on file    I discussed the assessment and treatment plan with the patient. The patient was provided an opportunity to ask questions and all were answered. The patient agreed with the plan and  demonstrated an understanding of the instructions.   The patient was advised to call back or seek an in-person evaluation if the symptoms worsen or if the condition fails to improve as anticipated.  I provided 39 minutes of non-face-to-face time during this encounter.  I appreciate the opportunity to take part in Rafia's care. Please do not hesitate to contact me with questions.  Sincerely,   R. Jorene Guest, MD

## 2018-08-27 NOTE — Addendum Note (Signed)
Addended by: Dub Mikes on: 08/27/2018 05:08 PM   Modules accepted: Orders

## 2018-08-28 ENCOUNTER — Telehealth: Payer: Self-pay | Admitting: Podiatry

## 2018-08-28 NOTE — Telephone Encounter (Signed)
cxled appt to puo pt aware.  Pt ok with mailing orthotics to her with instructions and will call if needed.

## 2018-08-30 ENCOUNTER — Other Ambulatory Visit: Payer: Federal, State, Local not specified - PPO | Admitting: Orthotics

## 2018-09-03 ENCOUNTER — Ambulatory Visit: Payer: Federal, State, Local not specified - PPO | Admitting: Allergy and Immunology

## 2018-09-05 ENCOUNTER — Ambulatory Visit (INDEPENDENT_AMBULATORY_CARE_PROVIDER_SITE_OTHER): Payer: Federal, State, Local not specified - PPO | Admitting: Orthopaedic Surgery

## 2018-09-06 ENCOUNTER — Ambulatory Visit (INDEPENDENT_AMBULATORY_CARE_PROVIDER_SITE_OTHER): Payer: Federal, State, Local not specified - PPO | Admitting: Orthopaedic Surgery

## 2018-09-06 ENCOUNTER — Other Ambulatory Visit: Payer: Self-pay

## 2018-09-06 ENCOUNTER — Encounter (INDEPENDENT_AMBULATORY_CARE_PROVIDER_SITE_OTHER): Payer: Self-pay | Admitting: Orthopaedic Surgery

## 2018-09-06 DIAGNOSIS — M25461 Effusion, right knee: Secondary | ICD-10-CM | POA: Diagnosis not present

## 2018-09-06 MED ORDER — METHYLPREDNISOLONE ACETATE 40 MG/ML IJ SUSP
40.0000 mg | INTRAMUSCULAR | Status: AC | PRN
  Administered 2018-09-06: 40 mg via INTRA_ARTICULAR

## 2018-09-06 MED ORDER — LIDOCAINE HCL 1 % IJ SOLN
5.0000 mL | INTRAMUSCULAR | Status: AC | PRN
  Administered 2018-09-06: 5 mL
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
## 2018-09-24 DIAGNOSIS — M5442 Lumbago with sciatica, left side: Secondary | ICD-10-CM | POA: Diagnosis not present

## 2018-09-24 DIAGNOSIS — F338 Other recurrent depressive disorders: Secondary | ICD-10-CM | POA: Diagnosis not present

## 2018-09-24 DIAGNOSIS — M9903 Segmental and somatic dysfunction of lumbar region: Secondary | ICD-10-CM | POA: Diagnosis not present

## 2018-09-24 DIAGNOSIS — F419 Anxiety disorder, unspecified: Secondary | ICD-10-CM | POA: Diagnosis not present

## 2018-09-24 DIAGNOSIS — M5137 Other intervertebral disc degeneration, lumbosacral region: Secondary | ICD-10-CM | POA: Diagnosis not present

## 2018-09-24 DIAGNOSIS — M5417 Radiculopathy, lumbosacral region: Secondary | ICD-10-CM | POA: Diagnosis not present

## 2018-09-24 DIAGNOSIS — F902 Attention-deficit hyperactivity disorder, combined type: Secondary | ICD-10-CM | POA: Diagnosis not present

## 2018-09-24 DIAGNOSIS — Z79899 Other long term (current) drug therapy: Secondary | ICD-10-CM | POA: Diagnosis not present

## 2018-10-18 DIAGNOSIS — Z01419 Encounter for gynecological examination (general) (routine) without abnormal findings: Secondary | ICD-10-CM | POA: Diagnosis not present

## 2018-11-26 ENCOUNTER — Encounter: Payer: Self-pay | Admitting: Allergy and Immunology

## 2018-11-26 ENCOUNTER — Other Ambulatory Visit: Payer: Self-pay

## 2018-11-26 ENCOUNTER — Ambulatory Visit: Payer: Federal, State, Local not specified - PPO | Admitting: Allergy and Immunology

## 2018-11-26 VITALS — BP 122/82 | HR 60 | Resp 16

## 2018-11-26 DIAGNOSIS — J3089 Other allergic rhinitis: Secondary | ICD-10-CM

## 2018-11-26 DIAGNOSIS — J4540 Moderate persistent asthma, uncomplicated: Secondary | ICD-10-CM

## 2018-11-26 DIAGNOSIS — K219 Gastro-esophageal reflux disease without esophagitis: Secondary | ICD-10-CM | POA: Diagnosis not present

## 2018-11-26 MED ORDER — FLOVENT HFA 44 MCG/ACT IN AERO: 2.0000 | INHALATION_SPRAY | Freq: Two times a day (BID) | RESPIRATORY_TRACT | 5 refills | Status: DC

## 2018-11-26 MED ORDER — MONTELUKAST SODIUM 10 MG PO TABS: 10.0000 mg | ORAL_TABLET | Freq: Every day | ORAL | 5 refills | Status: DC

## 2018-11-26 NOTE — Progress Notes (Signed)
Follow-up Note  RE: Wendy Shelton MRN: [id number] DOB: 09/20/1970 Date of Office Visit: 11/26/2018  Primary care provider: Leeroy Cha, MD Referring provider: Leeroy Cha,*  History of present illness: Wendy Shelton" Wendy Shelton is a 48 y.o. female with asthma, allergic rhinitis, and acid reflux presenting today for follow-up.  She was last evaluated via WebEx visit on August 27, 2018.  Reports that over the past few weeks she has been experiencing episodes of shortness of breath, however she believes that this may be due to wearing a facemask.  She admits that she is only been using Flovent 3 times per week on average rather than on a scheduled basis.  She feels that her nasal allergy symptoms are well controlled with montelukast and Xhance nasal spray as needed. She reports that she has been experiencing some reflux, particularly if she eats "too much."  She admits that she has not been taking any antacids.  On occasion, she experiences some hoarseness which she does not attribute to postnasal drainage.  Assessment and plan: Moderate persistent asthma  I have recommended using Flovent 44 micro grams on a consistent basis, 2 inhalations via spacer device twice daily.  Continue montelukast 10 mg daily and albuterol HFA, 1 to 2 inhalations every 4-6 hours if needed.  Subjective and objective measures of pulmonary function will be followed and the treatment plan will be adjusted accordingly.  Perennial and seasonal allergic rhinitis Stable.  Continue appropriate allergen avoidance measures, montelukast daily, and Xhance as needed.  Nasal saline spray (i.e., Simply Saline) or nasal saline lavage (i.e., NeilMed) is recommended as needed and prior to medicated nasal sprays.  For thick post nasal drainage, add guaifenesin 1200 mg (Mucinex Maximum Strength)  twice daily as needed with adequate hydration.  If allergen avoidance measures and  medications fail to adequately relieve symptoms, aeroallergen immunotherapy will be considered.  Acid reflux  Continue appropriate reflux lifestyle modifications and famotidine (Pepcid) 20 mg 1-2 times daily if needed.   Meds ordered this encounter  Medications  . montelukast (SINGULAIR) 10 MG tablet    Sig: Take 1 tablet (10 mg total) by mouth at bedtime.    Dispense:  30 tablet    Refill:  5  . fluticasone (FLOVENT HFA) 44 MCG/ACT inhaler    Sig: Inhale 2 puffs into the lungs 2 (two) times daily.    Dispense:  1 Inhaler    Refill:  5    Diagnostics: Spirometry reveals an FVC of 3.12 L and an FEV1 of 2.53 L (79% predicted) with an FEV1 ratio of 100%.  Please see scanned spirometry results for details.    Physical examination: Blood pressure 122/82, pulse 60, resp. rate 16, SpO2 97 %.  General: Alert, interactive, in no acute distress. HEENT: TMs pearly gray, turbinates mildly edematous without discharge, post-pharynx moderately erythematous. Neck: Supple without lymphadenopathy. Lungs: Clear to auscultation without wheezing, rhonchi or rales. CV: Normal S1, S2 without murmurs. Skin: Warm and dry, without lesions or rashes.  The following portions of the patient's history were reviewed and updated as appropriate: allergies, current medications, past family history, past medical history, past social history, past surgical history and problem list.  Allergies as of 11/26/2018      Reactions   Penicillins Hives   Amoxicillin    rash   Keflex [cephalexin]    rash   Sulfa Antibiotics    Rash      Medication List       Accurate as of  November 26, 2018  4:37 PM. If you have any questions, ask your nurse or doctor.        albuterol 108 (90 Base) MCG/ACT inhaler Commonly known as: VENTOLIN HFA Inhale 1-2 puffs into the lungs every 4 (four) hours as needed for wheezing or shortness of breath.   dexmethylphenidate 20 MG 24 hr capsule Commonly known as: FOCALIN XR TK 1 C PO D    Diclofenac Sodium 2 % Soln Commonly known as: Pennsaid Apply 2 g topically 2 (two) times daily as needed (to affected area).   Flovent HFA 44 MCG/ACT inhaler Generic drug: fluticasone Inhale 2 puffs into the lungs 2 (two) times daily. Started by: Wellington Hampshire Carter Theophil Thivierge, MD   Fluticasone Propionate 93 MCG/ACT Exhu Commonly known as: Xhance Place 2 sprays into the nose 2 (two) times daily as needed.   guanFACINE 1 MG Tb24 ER tablet Commonly known as: INTUNIV TK 1 T PO BID   montelukast 10 MG tablet Commonly known as: SINGULAIR Take 1 tablet (10 mg total) by mouth at bedtime.   mupirocin ointment 2 % Commonly known as: Bactroban Apply 1 application topically 2 (two) times daily.   valACYclovir 1000 MG tablet Commonly known as: VALTREX TAKE 2 TABLETS BY MOUTH 2 TIMES DAILY FOR 3-7 DAYS AS NEEDED       Allergies  Allergen Reactions  . Penicillins Hives  . Amoxicillin     rash  . Keflex [Cephalexin]     rash  . Sulfa Antibiotics     Rash    Review of systems: Review of systems negative except as noted in HPI / PMHx or noted below: Constitutional: Negative.  HENT: Negative.   Eyes: Negative.  Respiratory: Negative.   Cardiovascular: Negative.  Gastrointestinal: Negative.  Genitourinary: Negative.  Musculoskeletal: Negative.  Neurological: Negative.  Endo/Heme/Allergies: Negative.  Cutaneous: Negative.  Past Medical History:  Diagnosis Date  . ADHD   . Anxiety   . Asthma     Family History  Problem Relation Age of Onset  . Allergic rhinitis Mother   . Asthma Brother   . Angioedema Neg Hx   . Eczema Neg Hx   . Immunodeficiency Neg Hx   . Urticaria Neg Hx     Social History   Socioeconomic History  . Marital status: Single    Spouse name: Not on file  . Number of children: Not on file  . Years of education: Not on file  . Highest education level: Not on file  Occupational History  . Not on file  Social Needs  . Financial resource strain: Not on  file  . Food insecurity    Worry: Not on file    Inability: Not on file  . Transportation needs    Medical: Not on file    Non-medical: Not on file  Tobacco Use  . Smoking status: Former Smoker    Packs/day: 0.25    Years: 15.00    Pack years: 3.75    Types: Cigarettes  . Smokeless tobacco: Never Used  Substance and Sexual Activity  . Alcohol use: Yes  . Drug use: No  . Sexual activity: Not on file  Lifestyle  . Physical activity    Days per week: Not on file    Minutes per session: Not on file  . Stress: Not on file  Relationships  . Social Musicianconnections    Talks on phone: Not on file    Gets together: Not on file    Attends religious service:  Not on file    Active member of club or organization: Not on file    Attends meetings of clubs or organizations: Not on file    Relationship status: Not on file  . Intimate partner violence    Fear of current or ex partner: Not on file    Emotionally abused: Not on file    Physically abused: Not on file    Forced sexual activity: Not on file  Other Topics Concern  . Not on file  Social History Narrative  . Not on file    I appreciate the opportunity to take part in Kensley's care. Please do not hesitate to contact me with questions.  Sincerely,   R. Jorene Guestarter Hipolito Martinezlopez, MD

## 2018-11-26 NOTE — Assessment & Plan Note (Signed)
   Continue appropriate reflux lifestyle modifications and famotidine (Pepcid) 20 mg 1-2 times daily if needed. 

## 2018-11-26 NOTE — Assessment & Plan Note (Addendum)
Stable.  Continue appropriate allergen avoidance measures, montelukast daily, and Xhance as needed.  Nasal saline spray (i.e., Simply Saline) or nasal saline lavage (i.e., NeilMed) is recommended as needed and prior to medicated nasal sprays.  For thick post nasal drainage, add guaifenesin 1200 mg (Mucinex Maximum Strength)  twice daily as needed with adequate hydration.  If allergen avoidance measures and medications fail to adequately relieve symptoms, aeroallergen immunotherapy will be considered.

## 2018-11-26 NOTE — Patient Instructions (Addendum)
Moderate persistent asthma  I have recommended using Flovent 44 micro grams on a consistent basis, 2 inhalations via spacer device twice daily.  Continue montelukast 10 mg daily and albuterol HFA, 1 to 2 inhalations every 4-6 hours if needed.  Subjective and objective measures of pulmonary function will be followed and the treatment plan will be adjusted accordingly.  Perennial and seasonal allergic rhinitis Stable.  Continue appropriate allergen avoidance measures, montelukast daily, and Xhance as needed.  Nasal saline spray (i.e., Simply Saline) or nasal saline lavage (i.e., NeilMed) is recommended as needed and prior to medicated nasal sprays.  For thick post nasal drainage, add guaifenesin 1200 mg (Mucinex Maximum Strength)  twice daily as needed with adequate hydration.  If allergen avoidance measures and medications fail to adequately relieve symptoms, aeroallergen immunotherapy will be considered.  Acid reflux  Continue appropriate reflux lifestyle modifications and famotidine (Pepcid) 20 mg 1-2 times daily if needed.   Return in about 5 months (around 04/28/2019), or if symptoms worsen or fail to improve.

## 2018-11-26 NOTE — Assessment & Plan Note (Signed)
   I have recommended using Flovent 44 micro grams on a consistent basis, 2 inhalations via spacer device twice daily.  Continue montelukast 10 mg daily and albuterol HFA, 1 to 2 inhalations every 4-6 hours if needed.  Subjective and objective measures of pulmonary function will be followed and the treatment plan will be adjusted accordingly.

## 2018-12-26 DIAGNOSIS — M5442 Lumbago with sciatica, left side: Secondary | ICD-10-CM | POA: Diagnosis not present

## 2018-12-26 DIAGNOSIS — M9903 Segmental and somatic dysfunction of lumbar region: Secondary | ICD-10-CM | POA: Diagnosis not present

## 2018-12-26 DIAGNOSIS — M5137 Other intervertebral disc degeneration, lumbosacral region: Secondary | ICD-10-CM | POA: Diagnosis not present

## 2018-12-26 DIAGNOSIS — M5417 Radiculopathy, lumbosacral region: Secondary | ICD-10-CM | POA: Diagnosis not present

## 2019-01-01 DIAGNOSIS — M9903 Segmental and somatic dysfunction of lumbar region: Secondary | ICD-10-CM | POA: Diagnosis not present

## 2019-01-01 DIAGNOSIS — M5442 Lumbago with sciatica, left side: Secondary | ICD-10-CM | POA: Diagnosis not present

## 2019-01-01 DIAGNOSIS — M5137 Other intervertebral disc degeneration, lumbosacral region: Secondary | ICD-10-CM | POA: Diagnosis not present

## 2019-01-01 DIAGNOSIS — M5417 Radiculopathy, lumbosacral region: Secondary | ICD-10-CM | POA: Diagnosis not present

## 2019-01-03 DIAGNOSIS — Z1231 Encounter for screening mammogram for malignant neoplasm of breast: Secondary | ICD-10-CM | POA: Diagnosis not present

## 2019-01-04 DIAGNOSIS — F902 Attention-deficit hyperactivity disorder, combined type: Secondary | ICD-10-CM | POA: Diagnosis not present

## 2019-01-04 DIAGNOSIS — Z79899 Other long term (current) drug therapy: Secondary | ICD-10-CM | POA: Diagnosis not present

## 2019-01-04 DIAGNOSIS — G4733 Obstructive sleep apnea (adult) (pediatric): Secondary | ICD-10-CM | POA: Diagnosis not present

## 2019-01-04 DIAGNOSIS — F419 Anxiety disorder, unspecified: Secondary | ICD-10-CM | POA: Diagnosis not present

## 2019-01-14 DIAGNOSIS — E669 Obesity, unspecified: Secondary | ICD-10-CM | POA: Diagnosis not present

## 2019-01-14 DIAGNOSIS — Z0000 Encounter for general adult medical examination without abnormal findings: Secondary | ICD-10-CM | POA: Diagnosis not present

## 2019-01-14 DIAGNOSIS — Z1231 Encounter for screening mammogram for malignant neoplasm of breast: Secondary | ICD-10-CM | POA: Diagnosis not present

## 2019-01-14 DIAGNOSIS — J4520 Mild intermittent asthma, uncomplicated: Secondary | ICD-10-CM | POA: Diagnosis not present

## 2019-01-14 DIAGNOSIS — Z13220 Encounter for screening for lipoid disorders: Secondary | ICD-10-CM | POA: Diagnosis not present

## 2019-01-14 DIAGNOSIS — Z6830 Body mass index (BMI) 30.0-30.9, adult: Secondary | ICD-10-CM | POA: Diagnosis not present

## 2019-01-14 DIAGNOSIS — F3281 Premenstrual dysphoric disorder: Secondary | ICD-10-CM | POA: Diagnosis not present

## 2019-01-23 DIAGNOSIS — M25511 Pain in right shoulder: Secondary | ICD-10-CM | POA: Diagnosis not present

## 2019-03-01 DIAGNOSIS — M5417 Radiculopathy, lumbosacral region: Secondary | ICD-10-CM | POA: Diagnosis not present

## 2019-03-01 DIAGNOSIS — M5137 Other intervertebral disc degeneration, lumbosacral region: Secondary | ICD-10-CM | POA: Diagnosis not present

## 2019-03-01 DIAGNOSIS — M9903 Segmental and somatic dysfunction of lumbar region: Secondary | ICD-10-CM | POA: Diagnosis not present

## 2019-03-01 DIAGNOSIS — M5442 Lumbago with sciatica, left side: Secondary | ICD-10-CM | POA: Diagnosis not present

## 2019-03-02 ENCOUNTER — Other Ambulatory Visit: Payer: Self-pay | Admitting: Allergy and Immunology

## 2019-03-08 ENCOUNTER — Other Ambulatory Visit: Payer: Self-pay | Admitting: Orthopedic Surgery

## 2019-03-08 DIAGNOSIS — M25511 Pain in right shoulder: Secondary | ICD-10-CM | POA: Diagnosis not present

## 2019-03-08 DIAGNOSIS — M9903 Segmental and somatic dysfunction of lumbar region: Secondary | ICD-10-CM | POA: Diagnosis not present

## 2019-03-08 DIAGNOSIS — M5417 Radiculopathy, lumbosacral region: Secondary | ICD-10-CM | POA: Diagnosis not present

## 2019-03-08 DIAGNOSIS — M5442 Lumbago with sciatica, left side: Secondary | ICD-10-CM | POA: Diagnosis not present

## 2019-03-08 DIAGNOSIS — M5137 Other intervertebral disc degeneration, lumbosacral region: Secondary | ICD-10-CM | POA: Diagnosis not present

## 2019-03-26 DIAGNOSIS — F4321 Adjustment disorder with depressed mood: Secondary | ICD-10-CM | POA: Diagnosis not present

## 2019-04-01 ENCOUNTER — Other Ambulatory Visit: Payer: Federal, State, Local not specified - PPO

## 2019-04-01 ENCOUNTER — Other Ambulatory Visit: Payer: Self-pay

## 2019-04-01 ENCOUNTER — Ambulatory Visit
Admission: RE | Admit: 2019-04-01 | Discharge: 2019-04-01 | Disposition: A | Discharge status: Home / Self Care | Payer: Federal, State, Local not specified - PPO | Source: Ambulatory Visit | Attending: Orthopedic Surgery | Admitting: Orthopedic Surgery

## 2019-04-01 DIAGNOSIS — M25511 Pain in right shoulder: Secondary | ICD-10-CM

## 2019-04-02 DIAGNOSIS — F4323 Adjustment disorder with mixed anxiety and depressed mood: Secondary | ICD-10-CM | POA: Diagnosis not present

## 2019-04-03 DIAGNOSIS — M25511 Pain in right shoulder: Secondary | ICD-10-CM | POA: Diagnosis not present

## 2019-04-08 DIAGNOSIS — M7551 Bursitis of right shoulder: Secondary | ICD-10-CM | POA: Diagnosis not present

## 2019-04-08 DIAGNOSIS — M7541 Impingement syndrome of right shoulder: Secondary | ICD-10-CM | POA: Diagnosis not present

## 2019-04-08 DIAGNOSIS — S46011D Strain of muscle(s) and tendon(s) of the rotator cuff of right shoulder, subsequent encounter: Secondary | ICD-10-CM | POA: Diagnosis not present

## 2019-04-25 DIAGNOSIS — F902 Attention-deficit hyperactivity disorder, combined type: Secondary | ICD-10-CM | POA: Diagnosis not present

## 2019-04-25 DIAGNOSIS — F419 Anxiety disorder, unspecified: Secondary | ICD-10-CM | POA: Diagnosis not present

## 2019-04-25 DIAGNOSIS — Z79899 Other long term (current) drug therapy: Secondary | ICD-10-CM | POA: Diagnosis not present

## 2019-04-25 DIAGNOSIS — F338 Other recurrent depressive disorders: Secondary | ICD-10-CM | POA: Diagnosis not present

## 2019-04-29 ENCOUNTER — Encounter: Payer: Self-pay | Admitting: Allergy and Immunology

## 2019-04-29 ENCOUNTER — Other Ambulatory Visit: Payer: Self-pay

## 2019-04-29 ENCOUNTER — Ambulatory Visit (INDEPENDENT_AMBULATORY_CARE_PROVIDER_SITE_OTHER): Payer: Federal, State, Local not specified - PPO | Admitting: Allergy and Immunology

## 2019-04-29 DIAGNOSIS — K219 Gastro-esophageal reflux disease without esophagitis: Secondary | ICD-10-CM

## 2019-04-29 DIAGNOSIS — J4540 Moderate persistent asthma, uncomplicated: Secondary | ICD-10-CM

## 2019-04-29 DIAGNOSIS — J3089 Other allergic rhinitis: Secondary | ICD-10-CM

## 2019-04-29 MED ORDER — FLOVENT HFA 44 MCG/ACT IN AERO: 2.0000 | INHALATION_SPRAY | Freq: Two times a day (BID) | RESPIRATORY_TRACT | 5 refills | Status: DC

## 2019-04-29 NOTE — Assessment & Plan Note (Signed)
Stable.  Continue appropriate reflux lifestyle modifications and Alka-Seltzer if needed.

## 2019-04-29 NOTE — Assessment & Plan Note (Signed)
   I have recommended using Flovent 44 g on a consistent basis, 2 inhalations via spacer device twice daily.  Continue montelukast 10 mg daily and albuterol HFA, 1 to 2 inhalations every 4-6 hours if needed.  Subjective and objective measures of pulmonary function will be followed and the treatment plan will be adjusted accordingly.

## 2019-04-29 NOTE — Patient Instructions (Signed)
Moderate persistent asthma  I have recommended using Flovent 44 g on a consistent basis, 2 inhalations via spacer device twice daily.  Continue montelukast 10 mg daily and albuterol HFA, 1 to 2 inhalations every 4-6 hours if needed.  Subjective and objective measures of pulmonary function will be followed and the treatment plan will be adjusted accordingly.  Perennial and seasonal allergic rhinitis  Continue appropriate allergen avoidance measures, montelukast daily, and Xhance as needed.  Nasal saline spray (i.e., Simply Saline) or nasal saline lavage (i.e., NeilMed) is recommended as needed and prior to medicated nasal sprays.  For thick post nasal drainage, add guaifenesin 1200 mg (Mucinex Maximum Strength)  twice daily as needed with adequate hydration.  If allergen avoidance measures and medications fail to adequately relieve symptoms, aeroallergen immunotherapy will be considered.  Acid reflux Stable.  Continue appropriate reflux lifestyle modifications and Alka-Seltzer if needed.   Return in about 4 months (around 08/28/2019), or if symptoms worsen or fail to improve.

## 2019-04-29 NOTE — Progress Notes (Signed)
Follow-up Telemedicine Note  RE: Wendy Shelton MRN: [id number] DOB: 02-Jan-1971 Date of Telemedicine Visit: 04/29/2019  Primary care provider: Lorenda Ishihara, MD Referring provider: Lorenda Ishihara,*  Telemedicine Follow Up Visit via Telephone: I connected with Wendy Shelton for a follow up on 04/29/19 by telephone and verified that I am speaking with the correct person using two identifiers.   The limitations, risks, security and privacy concerns of performing an evaluation and management service by telemedicine, the availability of in person appointments, and that there may be a patient responsible charge related to this service were discussed. The patient expressed understanding and agreed to proceed.  Patient is at home.  Provider is at the office.  Visit start time: 2:31 PM Visit end time: 2:56 PM Insurance consent/check in by: Victorino Dike Medical consent and medical assistant/nurse: Dorathy Daft  History of present illness: Wendy Shelton is a 48 y.o. female with persistent asthma, allergic rhinitis, and acid reflux presenting today via telemedicine for follow-up.  She was last evaluated in this clinic on November 26, 2018.  She reports that she has been experiencing asthma symptoms 2 times per week on average.  She does not complain of nocturnal awakenings due to lower respiratory symptoms.  She admits that she has not been using Flovent on a consistent basis.  She is taking montelukast daily and states that if she misses a dose of montelukast she notices increased symptoms.  She reports that [facility] has provided nasal symptom relief, however she has not been consistent with this medication and recently has been experiencing some nasal congestion.  She reports that her reflux is well controlled if she is temperate with her diet and, if she experiences reflux, she finds relief with Alka-Seltzer chewables.  Assessment and plan: Moderate persistent  asthma  I have recommended using Flovent 44 g on a consistent basis, 2 inhalations via spacer device twice daily.  Continue montelukast 10 mg daily and albuterol HFA, 1 to 2 inhalations every 4-6 hours if needed.  Subjective and objective measures of pulmonary function will be followed and the treatment plan will be adjusted accordingly.  Perennial and seasonal allergic rhinitis  Continue appropriate allergen avoidance measures, montelukast daily, and Xhance as needed.  Nasal saline spray (i.e., Simply Saline) or nasal saline lavage (i.e., NeilMed) is recommended as needed and prior to medicated nasal sprays.  For thick post nasal drainage, add guaifenesin 1200 mg (Mucinex Maximum Strength)  twice daily as needed with adequate hydration.  If allergen avoidance measures and medications fail to adequately relieve symptoms, aeroallergen immunotherapy will be considered.  Acid reflux Stable.  Continue appropriate reflux lifestyle modifications and Alka-Seltzer if needed.   Meds ordered this encounter  Medications  . fluticasone (FLOVENT HFA) 44 MCG/ACT inhaler    Sig: Inhale 2 puffs into the lungs 2 (two) times daily.    Dispense:  1 Inhaler    Refill:  5   Diagnostics: None.  Physical examination: Physical Exam Not obtained as encounter was done via telephone.   The following portions of the patient's history were reviewed and updated as appropriate: allergies, current medications, past family history, past medical history, past social history, past surgical history and problem list.  Allergies as of 04/29/2019      Reactions   Penicillins Hives   Amoxicillin    rash   Keflex [cephalexin]    rash   Sulfa Antibiotics    Rash      Medication List  Accurate as of April 29, 2019  3:16 PM. If you have any questions, ask your nurse or doctor.        albuterol 108 (90 Base) MCG/ACT inhaler Commonly known as: VENTOLIN HFA Inhale 1-2 puffs into the lungs every 4  (four) hours as needed for wheezing or shortness of breath.   ALPRAZolam 0.5 MG tablet Commonly known as: XANAX   dexmethylphenidate 20 MG 24 hr capsule Commonly known as: FOCALIN XR TK 1 C PO D   Diclofenac Sodium 2 % Soln Commonly known as: Pennsaid Apply 2 g topically 2 (two) times daily as needed (to affected area).   Flovent HFA 44 MCG/ACT inhaler Generic drug: fluticasone Inhale 2 puffs into the lungs 2 (two) times daily.   Fluticasone Propionate 93 MCG/ACT Exhu Commonly known as: Xhance Place 2 sprays into the nose 2 (two) times daily as needed.   guanFACINE 1 MG Tb24 ER tablet Commonly known as: INTUNIV TK 1 T PO BID   montelukast 10 MG tablet Commonly known as: SINGULAIR TAKE 1 TABLET(10 MG) BY MOUTH AT BEDTIME   mupirocin ointment 2 % Commonly known as: Bactroban Apply 1 application topically 2 (two) times daily.   valACYclovir 1000 MG tablet Commonly known as: VALTREX TAKE 2 TABLETS BY MOUTH 2 TIMES DAILY FOR 3-7 DAYS AS NEEDED       Allergies  Allergen Reactions  . Penicillins Hives  . Amoxicillin     rash  . Keflex [Cephalexin]     rash  . Sulfa Antibiotics     Rash     Previous notes and tests were reviewed.  I discussed the assessment and treatment plan with the patient. The patient was provided an opportunity to ask questions and all were answered. The patient agreed with the plan and demonstrated an understanding of the instructions.   The patient was advised to call back or seek an in-person evaluation if the symptoms worsen or if the condition fails to improve as anticipated.  I provided 25 minutes of non-face-to-face time during this encounter.  I appreciate the opportunity to take part in Jordie's care. Please do not hesitate to contact me with questions.  Sincerely,   R. Edgar Frisk, MD

## 2019-04-29 NOTE — Assessment & Plan Note (Signed)
   Continue appropriate allergen avoidance measures, montelukast daily, and Xhance as needed.  Nasal saline spray (i.e., Simply Saline) or nasal saline lavage (i.e., NeilMed) is recommended as needed and prior to medicated nasal sprays.  For thick post nasal drainage, add guaifenesin 1200 mg (Mucinex Maximum Strength)  twice daily as needed with adequate hydration.  If allergen avoidance measures and medications fail to adequately relieve symptoms, aeroallergen immunotherapy will be considered.

## 2019-04-30 DIAGNOSIS — M7551 Bursitis of right shoulder: Secondary | ICD-10-CM | POA: Diagnosis not present

## 2019-04-30 DIAGNOSIS — S46011D Strain of muscle(s) and tendon(s) of the rotator cuff of right shoulder, subsequent encounter: Secondary | ICD-10-CM | POA: Diagnosis not present

## 2019-04-30 DIAGNOSIS — M7541 Impingement syndrome of right shoulder: Secondary | ICD-10-CM | POA: Diagnosis not present

## 2019-05-07 DIAGNOSIS — N951 Menopausal and female climacteric states: Secondary | ICD-10-CM | POA: Diagnosis not present

## 2019-05-07 DIAGNOSIS — R6882 Decreased libido: Secondary | ICD-10-CM | POA: Diagnosis not present

## 2019-06-05 DIAGNOSIS — M7541 Impingement syndrome of right shoulder: Secondary | ICD-10-CM | POA: Diagnosis not present

## 2019-06-20 DIAGNOSIS — E349 Endocrine disorder, unspecified: Secondary | ICD-10-CM | POA: Diagnosis not present

## 2019-06-20 DIAGNOSIS — R799 Abnormal finding of blood chemistry, unspecified: Secondary | ICD-10-CM | POA: Diagnosis not present

## 2019-06-20 DIAGNOSIS — E8881 Metabolic syndrome: Secondary | ICD-10-CM | POA: Diagnosis not present

## 2019-06-20 DIAGNOSIS — E079 Disorder of thyroid, unspecified: Secondary | ICD-10-CM | POA: Diagnosis not present

## 2019-06-20 DIAGNOSIS — R5382 Chronic fatigue, unspecified: Secondary | ICD-10-CM | POA: Diagnosis not present

## 2019-07-23 DIAGNOSIS — F419 Anxiety disorder, unspecified: Secondary | ICD-10-CM | POA: Diagnosis not present

## 2019-07-23 DIAGNOSIS — F902 Attention-deficit hyperactivity disorder, combined type: Secondary | ICD-10-CM | POA: Diagnosis not present

## 2019-07-23 DIAGNOSIS — Z79899 Other long term (current) drug therapy: Secondary | ICD-10-CM | POA: Diagnosis not present

## 2019-07-23 DIAGNOSIS — F338 Other recurrent depressive disorders: Secondary | ICD-10-CM | POA: Diagnosis not present

## 2019-08-02 ENCOUNTER — Other Ambulatory Visit: Payer: Self-pay | Admitting: Allergy and Immunology

## 2019-09-05 DIAGNOSIS — D2261 Melanocytic nevi of right upper limb, including shoulder: Secondary | ICD-10-CM | POA: Diagnosis not present

## 2019-09-05 DIAGNOSIS — L814 Other melanin hyperpigmentation: Secondary | ICD-10-CM | POA: Diagnosis not present

## 2019-09-05 DIAGNOSIS — L821 Other seborrheic keratosis: Secondary | ICD-10-CM | POA: Diagnosis not present

## 2019-09-05 DIAGNOSIS — L816 Other disorders of diminished melanin formation: Secondary | ICD-10-CM | POA: Diagnosis not present

## 2019-09-17 ENCOUNTER — Other Ambulatory Visit: Payer: Self-pay

## 2019-09-17 ENCOUNTER — Encounter: Payer: Self-pay | Admitting: Podiatry

## 2019-09-17 ENCOUNTER — Ambulatory Visit: Payer: Federal, State, Local not specified - PPO | Admitting: Podiatry

## 2019-09-17 ENCOUNTER — Ambulatory Visit (INDEPENDENT_AMBULATORY_CARE_PROVIDER_SITE_OTHER): Payer: Federal, State, Local not specified - PPO

## 2019-09-17 DIAGNOSIS — T8484XA Pain due to internal orthopedic prosthetic devices, implants and grafts, initial encounter: Secondary | ICD-10-CM

## 2019-09-17 NOTE — Patient Instructions (Signed)
Pre-Operative Instructions  Congratulations, you have decided to take an important step towards improving your quality of life.  You can be assured that the doctors and staff at [facility] will be with you every step of the way.  Here are some important things you should know:  1. Plan to be at the surgery center/hospital at least 1 (one) hour prior to your scheduled time, unless otherwise directed by the surgical center/hospital staff.  You must have a responsible adult accompany you, remain during the surgery and drive you home.  Make sure you have directions to the surgical center/hospital to ensure you arrive on time. 2. If you are having surgery at [facility] or [facility], you will need a copy of your medical history and physical form from your family physician within one month prior to the date of surgery. We will give you a form for your primary physician to complete.  3. We make every effort to accommodate the date you request for surgery.  However, there are times where surgery dates or times have to be moved.  We will contact you as soon as possible if a change in schedule is required.   4. No aspirin/ibuprofen for one week before surgery.  If you are on aspirin, any non-steroidal anti-inflammatory medications (Mobic, Aleve, Ibuprofen) should not be taken seven (7) days prior to your surgery.  You make take Tylenol for pain prior to surgery.  5. Medications - If you are taking daily heart and blood pressure medications, seizure, reflux, allergy, asthma, anxiety, pain or diabetes medications, make sure you notify the surgery center/hospital before the day of surgery so they can tell you which medications you should take or avoid the day of surgery. 6. No food or drink after midnight the night before surgery unless directed otherwise by surgical center/hospital staff. 7. No alcoholic beverages 24-hours prior to surgery.  No smoking 24-hours prior or 24-hours after  surgery. 8. Wear loose pants or shorts. They should be loose enough to fit over bandages, boots, and casts. 9. Don't wear slip-on shoes. Sneakers are preferred. 10. Bring your boot with you to the surgery center/hospital.  Also bring crutches or a walker if your physician has prescribed it for you.  If you do not have this equipment, it will be provided for you after surgery. 11. If you have not been contacted by the surgery center/hospital by the day before your surgery, call to confirm the date and time of your surgery. 12. Leave-time from work may vary depending on the type of surgery you have.  Appropriate arrangements should be made prior to surgery with your employer. 13. Prescriptions will be provided immediately following surgery by your doctor.  Fill these as soon as possible after surgery and take the medication as directed. Pain medications will not be refilled on weekends and must be approved by the doctor. 14. Remove nail polish on the operative foot and avoid getting pedicures prior to surgery. 15. Wash the night before surgery.  The night before surgery wash the foot and leg well with water and the antibacterial soap provided. Be sure to pay special attention to beneath the toenails and in between the toes.  Wash for at least three (3) minutes. Rinse thoroughly with water and dry well with a towel.  Perform this wash unless told not to do so by your physician.  Enclosed: 1 Ice pack (please put in freezer the night before surgery)   1 Hibiclens skin cleaner     Pre-op instructions  If you have any questions regarding the instructions, please do not hesitate to call our office.  [place]: [address], , [place] [address] -- [phone]  [place]: [address] [place], [place] [address] -- [phone]  [place]: [address], [place],  [address] -- [phone]   Website: [email or web address] 

## 2019-09-18 NOTE — Progress Notes (Signed)
She presents today chief complaint of painful fourth toe left foot.  She states that she like to have the screw removed seems to be rubbing on her toenail from underneath and she like to have it out.  She denies any changes in her past medical history medications allergies surgeries or social history.  Objective: Vital signs are stable alert and oriented x3 pulses are palpable.  The foot itself looks good bunions in good position she has slightly elevated second toe at the metatarsophalangeal joint from the surgery.  She also has tenderness and mild erythema overlying the nail and distal aspect of the nail plate secondary to the internal fixation.  Radiographs of the left foot taken today demonstrates internal fixation first second metatarsals second third and fourth toes left foot.  She showed me her right foot and she states that now need to get these toes fixed at some point as she is talking about her toes that are curling she looks like she is going to have just the toes to do the first metatarsophalangeal joint appears rectus.  Assessment: Painful internal fixation fourth toe left foot.  Plan: Consented her today for removal of painful internal fixation fourth toe left foot.  We discussed the pros and cons of surgery she understands the possible complications associated with this and we will follow-up with her after she has returned from her trip in the end of May.

## 2019-09-30 ENCOUNTER — Other Ambulatory Visit: Payer: Self-pay

## 2019-09-30 ENCOUNTER — Encounter: Payer: Self-pay | Admitting: Allergy and Immunology

## 2019-09-30 ENCOUNTER — Ambulatory Visit: Payer: Federal, State, Local not specified - PPO | Admitting: Allergy and Immunology

## 2019-09-30 VITALS — BP 124/86 | HR 58 | Temp 97.6°F | Resp 16 | Ht 67.0 in | Wt 196.2 lb

## 2019-09-30 DIAGNOSIS — J4540 Moderate persistent asthma, uncomplicated: Secondary | ICD-10-CM

## 2019-09-30 DIAGNOSIS — R490 Dysphonia: Secondary | ICD-10-CM | POA: Diagnosis not present

## 2019-09-30 DIAGNOSIS — J3089 Other allergic rhinitis: Secondary | ICD-10-CM | POA: Diagnosis not present

## 2019-09-30 DIAGNOSIS — K219 Gastro-esophageal reflux disease without esophagitis: Secondary | ICD-10-CM

## 2019-09-30 MED ORDER — XHANCE 93 MCG/ACT NA EXHU: 2.0000 | INHALANT_SUSPENSION | Freq: Two times a day (BID) | NASAL | 3 refills | Status: DC | PRN

## 2019-09-30 MED ORDER — FLOVENT HFA 44 MCG/ACT IN AERO: 2.0000 | INHALATION_SPRAY | Freq: Two times a day (BID) | RESPIRATORY_TRACT | 5 refills | Status: DC

## 2019-09-30 MED ORDER — MONTELUKAST SODIUM 10 MG PO TABS: ORAL_TABLET | ORAL | 0 refills | Status: DC

## 2019-09-30 MED ORDER — ALBUTEROL SULFATE HFA 108 (90 BASE) MCG/ACT IN AERS: 1.0000 | INHALATION_SPRAY | RESPIRATORY_TRACT | 1 refills | Status: DC | PRN

## 2019-09-30 NOTE — Patient Instructions (Addendum)
Perennial and seasonal allergic rhinitis  Continue appropriate allergen avoidance measures, montelukast daily, and Xhance as needed.  Nasal saline spray (i.e., Simply Saline) or nasal saline lavage (i.e., NeilMed) is recommended as needed and prior to medicated nasal sprays.  For thick post nasal drainage, add guaifenesin 1200 mg (Mucinex Maximum Strength)  twice daily as needed with adequate hydration.  For sneezing, runny nose, itchy nose, and itchy eyes, may use levocetirizine (Xyzal) 5 mg daily if needed.  If allergen avoidance measures and medications fail to adequately relieve symptoms, aeroallergen immunotherapy will be considered.  Moderate persistent asthma  Flovent 44 g, 2 inhalations via spacer device twice daily.  Continue montelukast 10 mg daily and albuterol HFA, 1 to 2 inhalations every 4-6 hours if needed.  Subjective and objective measures of pulmonary function will be followed and the treatment plan will be adjusted accordingly.  Acid reflux  Continue appropriate reflux lifestyle modifications and Alka-Seltzer if needed.  If needed, add famotidine (Pepcid) 20 mg 1-2 times daily.  Hoarseness Most likely from postnasal drainage plus/minus acid reflux.  Treatment plan as outlined above.   Return in about 5 months (around 03/01/2020), or if symptoms worsen or fail to improve.

## 2019-09-30 NOTE — Progress Notes (Signed)
Follow-up Note  RE: [name] MRN: [id number] DOB: 12-27-1970 Date of Office Visit: 09/30/2019  Primary care provider: Leeroy Cha, MD Referring provider: Leeroy Cha,*  History of present illness: Wendy Shelton is a 49 y.o. female with persistent asthma, allergic rhinitis, and acid reflux presenting today for follow-up.  She was last evaluated in this clinic via telemedicine in December 2020. She is currently taking Flovent 44 g, 2 inhalations via spacer device twice daily, and montelukast 10 mg daily at bedtime. She admits that she occasionally forgets to take the evening dose of Flovent. She typically requires albuterol rescue 1-2 times per month on average and denies limitations in normal daily activities or nocturnal awakenings due to lower respiratory symptoms. She is currently attempting to control her nasal allergy symptoms with loratadine daily and Xhance nasal spray occasionally as needed. She reports that she has been experiencing some throat irritation and hoarseness recently.  Assessment and plan: Perennial and seasonal allergic rhinitis  Continue appropriate allergen avoidance measures, montelukast daily, and Xhance as needed.  Nasal saline spray (i.e., Simply Saline) or nasal saline lavage (i.e., NeilMed) is recommended as needed and prior to medicated nasal sprays.  For thick post nasal drainage, add guaifenesin 1200 mg (Mucinex Maximum Strength)  twice daily as needed with adequate hydration.  For sneezing, runny nose, itchy nose, and itchy eyes, may use levocetirizine (Xyzal) 5 mg daily if needed.  If allergen avoidance measures and medications fail to adequately relieve symptoms, aeroallergen immunotherapy will be considered.  Moderate persistent asthma  Flovent 44 g, 2 inhalations via spacer device twice daily.  Continue montelukast 10 mg daily and albuterol HFA, 1 to 2 inhalations every 4-6 hours if  needed.  Subjective and objective measures of pulmonary function will be followed and the treatment plan will be adjusted accordingly.  Acid reflux  Continue appropriate reflux lifestyle modifications and Alka-Seltzer if needed.  If needed, add famotidine (Pepcid) 20 mg 1-2 times daily.  Hoarseness Most likely from postnasal drainage plus/minus acid reflux.  Treatment plan as outlined above.   Meds ordered this encounter  Medications  . fluticasone (FLOVENT HFA) 44 MCG/ACT inhaler    Sig: Inhale 2 puffs into the lungs 2 (two) times daily.    Dispense:  1 Inhaler    Refill:  5  . montelukast (SINGULAIR) 10 MG tablet    Sig: TAKE 1 TABLET(10 MG) BY MOUTH AT BEDTIME    Dispense:  90 tablet    Refill:  0    Pt needs office visit before next refills are given  . Fluticasone Propionate (XHANCE) 93 MCG/ACT EXHU    Sig: Place 2 sprays into the nose 2 (two) times daily as needed.    Dispense:  16 mL    Refill:  3    [phone]  . albuterol (VENTOLIN HFA) 108 (90 Base) MCG/ACT inhaler    Sig: Inhale 1-2 puffs into the lungs every 4 (four) hours as needed for wheezing or shortness of breath.    Dispense:  18 g    Refill:  1    Diagnostics: Spirometry:  Normal with an FEV1 of 83% predicted with an FEV1 ratio of 101%. This study was performed while the patient was asymptomatic.  Please see scanned spirometry results for details.    Physical examination: Blood pressure 124/86, pulse (!) 58, temperature 97.6 F (36.4 C), temperature source Temporal, resp. rate 16, height 5\' 7"  (1.702 m), weight 196 lb 3.2 oz (89 kg), SpO2 100 %.  General: Alert, interactive, in no acute distress. HEENT: TMs pearly gray, turbinates mildly edematous without discharge, post-pharynx moderately erythematous. Neck: Supple without lymphadenopathy. Lungs: Clear to auscultation without wheezing, rhonchi or rales. CV: Normal S1, S2 without murmurs. Skin: Warm and dry, without lesions or rashes.  The  following portions of the patient's history were reviewed and updated as appropriate: allergies, current medications, past family history, past medical history, past social history, past surgical history and problem list.  Current Outpatient Medications  Medication Sig Dispense Refill  . albuterol (VENTOLIN HFA) 108 (90 Base) MCG/ACT inhaler Inhale 1-2 puffs into the lungs every 4 (four) hours as needed for wheezing or shortness of breath. 18 g 1  . ALPRAZolam (XANAX) 0.5 MG tablet     . dexmethylphenidate (FOCALIN XR) 20 MG 24 hr capsule TK 1 C PO D  0  . Diclofenac Sodium (PENNSAID) 2 % SOLN Apply 2 g topically 2 (two) times daily as needed (to affected area). 112 g 3  . fluticasone (FLOVENT HFA) 44 MCG/ACT inhaler Inhale 2 puffs into the lungs 2 (two) times daily. 1 Inhaler 5  . Fluticasone Propionate (XHANCE) 93 MCG/ACT EXHU Place 2 sprays into the nose 2 (two) times daily as needed. 16 mL 3  . guanFACINE (INTUNIV) 1 MG TB24 ER tablet TK 1 T PO BID  2  . montelukast (SINGULAIR) 10 MG tablet TAKE 1 TABLET(10 MG) BY MOUTH AT BEDTIME 90 tablet 0  . NP THYROID 60 MG tablet Take 60 mg by mouth daily.    . progesterone (PROMETRIUM) 100 MG capsule Take 100 mg by mouth at bedtime.    . valACYclovir (VALTREX) 1000 MG tablet TAKE 2 TABLETS BY MOUTH 2 TIMES DAILY FOR 3-7 DAYS AS NEEDED  0  . fluorouracil (EFUDEX) 5 % cream APPLY TOPICALLY TO THE AFFECTED AREA TWICE DAILY FOR 14 DAYS    . mupirocin ointment (BACTROBAN) 2 % Apply 1 application topically 2 (two) times daily. (Patient not taking: Reported on 09/30/2019) 22 g 2   No current facility-administered medications for this visit.    Allergies  Allergen Reactions  . Penicillins Hives  . Amoxicillin     rash  . Keflex [Cephalexin]     rash  . Sulfa Antibiotics     Rash    Review of systems: Review of systems negative except as noted in HPI / PMHx.  Past Medical History:  Diagnosis Date  . ADHD   . Anxiety   . Asthma     Family  History  Problem Relation Age of Onset  . Allergic rhinitis Mother   . Asthma Brother   . Angioedema Neg Hx   . Eczema Neg Hx   . Immunodeficiency Neg Hx   . Urticaria Neg Hx     Social History   Socioeconomic History  . Marital status: Married    Spouse name: Not on file  . Number of children: Not on file  . Years of education: Not on file  . Highest education level: Not on file  Occupational History  . Not on file  Tobacco Use  . Smoking status: Former Smoker    Packs/day: 0.25    Years: 15.00    Pack years: 3.75    Types: Cigarettes  . Smokeless tobacco: Never Used  Substance and Sexual Activity  . Alcohol use: Yes  . Drug use: No  . Sexual activity: Not on file  Other Topics Concern  . Not on file  Social History Narrative  .  Not on file   Social Determinants of Health   Financial Resource Strain:   . Difficulty of Paying Living Expenses:   Food Insecurity:   . Worried About Programme researcher, broadcasting/film/video in the Last Year:   . Barista in the Last Year:   Transportation Needs:   . Freight forwarder (Medical):   Marland Kitchen Lack of Transportation (Non-Medical):   Physical Activity:   . Days of Exercise per Week:   . Minutes of Exercise per Session:   Stress:   . Feeling of Stress :   Social Connections:   . Frequency of Communication with Friends and Family:   . Frequency of Social Gatherings with Friends and Family:   . Attends Religious Services:   . Active Member of Clubs or Organizations:   . Attends Banker Meetings:   Marland Kitchen Marital Status:   Intimate Partner Violence:   . Fear of Current or Ex-Partner:   . Emotionally Abused:   Marland Kitchen Physically Abused:   . Sexually Abused:     I appreciate the opportunity to take part in Concetta's care. Please do not hesitate to contact me with questions.  Sincerely,   R. Jorene Guest, MD

## 2019-09-30 NOTE — Assessment & Plan Note (Signed)
   Flovent 44 g, 2 inhalations via spacer device twice daily.  Continue montelukast 10 mg daily and albuterol HFA, 1 to 2 inhalations every 4-6 hours if needed.  Subjective and objective measures of pulmonary function will be followed and the treatment plan will be adjusted accordingly.

## 2019-09-30 NOTE — Assessment & Plan Note (Signed)
Most likely from postnasal drainage plus/minus acid reflux.  Treatment plan as outlined above.

## 2019-09-30 NOTE — Assessment & Plan Note (Signed)
   Continue appropriate allergen avoidance measures, montelukast daily, and Xhance as needed.  Nasal saline spray (i.e., Simply Saline) or nasal saline lavage (i.e., NeilMed) is recommended as needed and prior to medicated nasal sprays.  For thick post nasal drainage, add guaifenesin 1200 mg (Mucinex Maximum Strength)  twice daily as needed with adequate hydration.  For sneezing, runny nose, itchy nose, and itchy eyes, may use levocetirizine (Xyzal) 5 mg daily if needed.  If allergen avoidance measures and medications fail to adequately relieve symptoms, aeroallergen immunotherapy will be considered.

## 2019-09-30 NOTE — Assessment & Plan Note (Signed)
   Continue appropriate reflux lifestyle modifications and Alka-Seltzer if needed.  If needed, add famotidine (Pepcid) 20 mg 1-2 times daily.

## 2019-10-07 ENCOUNTER — Telehealth: Payer: Self-pay

## 2019-10-07 NOTE — Telephone Encounter (Signed)
Prior authorization for [employer].

## 2019-10-09 NOTE — Telephone Encounter (Signed)
Received a letter request from [facility]. The reconsideration request letter must be from the patient. I did leave a message for the patient requesting a call back to discuss this further.

## 2019-10-09 NOTE — Telephone Encounter (Signed)
Appeal submitted to insurance

## 2019-10-10 NOTE — Telephone Encounter (Signed)
I left another message for the patient to call the [place] office. I also sent a MyChart message requesting a call back too.

## 2019-10-15 NOTE — Telephone Encounter (Signed)
Verbal consent for appeal received from patient. She is also sending written request for reconsideration of coverage to be sent to her insurance provider.

## 2019-10-24 DIAGNOSIS — L578 Other skin changes due to chronic exposure to nonionizing radiation: Secondary | ICD-10-CM | POA: Diagnosis not present

## 2019-10-24 DIAGNOSIS — D2261 Melanocytic nevi of right upper limb, including shoulder: Secondary | ICD-10-CM | POA: Diagnosis not present

## 2019-10-24 DIAGNOSIS — D1801 Hemangioma of skin and subcutaneous tissue: Secondary | ICD-10-CM | POA: Diagnosis not present

## 2019-10-24 DIAGNOSIS — L814 Other melanin hyperpigmentation: Secondary | ICD-10-CM | POA: Diagnosis not present

## 2019-10-24 DIAGNOSIS — L821 Other seborrheic keratosis: Secondary | ICD-10-CM | POA: Diagnosis not present

## 2019-10-24 DIAGNOSIS — D485 Neoplasm of uncertain behavior of skin: Secondary | ICD-10-CM | POA: Diagnosis not present

## 2019-10-28 ENCOUNTER — Other Ambulatory Visit: Payer: Self-pay | Admitting: Allergy and Immunology

## 2019-10-31 ENCOUNTER — Other Ambulatory Visit: Payer: Self-pay | Admitting: Podiatry

## 2019-10-31 MED ORDER — CLINDAMYCIN HCL 150 MG PO CAPS: 150.0000 mg | ORAL_CAPSULE | Freq: Three times a day (TID) | ORAL | 0 refills | Status: DC

## 2019-10-31 MED ORDER — HYDROCODONE-ACETAMINOPHEN 10-325 MG PO TABS: 1.0000 | ORAL_TABLET | Freq: Four times a day (QID) | ORAL | 0 refills | Status: AC | PRN

## 2019-11-01 DIAGNOSIS — T8484XA Pain due to internal orthopedic prosthetic devices, implants and grafts, initial encounter: Secondary | ICD-10-CM | POA: Diagnosis not present

## 2019-11-01 DIAGNOSIS — Z4889 Encounter for other specified surgical aftercare: Secondary | ICD-10-CM | POA: Diagnosis not present

## 2019-11-01 DIAGNOSIS — J45909 Unspecified asthma, uncomplicated: Secondary | ICD-10-CM | POA: Diagnosis not present

## 2019-11-07 ENCOUNTER — Ambulatory Visit (INDEPENDENT_AMBULATORY_CARE_PROVIDER_SITE_OTHER): Payer: Federal, State, Local not specified - PPO

## 2019-11-07 ENCOUNTER — Ambulatory Visit (INDEPENDENT_AMBULATORY_CARE_PROVIDER_SITE_OTHER): Payer: Federal, State, Local not specified - PPO | Admitting: Podiatry

## 2019-11-07 ENCOUNTER — Other Ambulatory Visit: Payer: Self-pay

## 2019-11-07 ENCOUNTER — Encounter: Payer: Self-pay | Admitting: Podiatry

## 2019-11-07 DIAGNOSIS — T8484XA Pain due to internal orthopedic prosthetic devices, implants and grafts, initial encounter: Secondary | ICD-10-CM

## 2019-11-09 NOTE — Progress Notes (Signed)
She presents today for postop visit date of surgery 11/01/2019 removal painful screw fixation fourth toe left foot.  She states is doing good for the last couple of days.  Objective: Vital signs are stable alert oriented x3 there is mild edema no erythema cellulitis drainage or odor was a dry sterile dressing was removed.  Sutures remained intact she had mild tenderness on palpation.  Radiographs taken today demonstrate screw is intact to the toe with the exception of the head of the screw which was most painful for her.  We could not remove the entire screw due to fracturing of the head of the screw.  Assessment: Well-healing surgical foot.  Plan: Redressed the toe today dressed a compressive dressing encouraged her to keep this dry and elevated is much as possible we will follow-up with her in a couple weeks for suture removal.

## 2019-11-11 ENCOUNTER — Telehealth: Payer: Self-pay | Admitting: *Deleted

## 2019-11-11 NOTE — Telephone Encounter (Signed)
Left message informing pt Dr. Al Corpus would evaluate the surgery site after removing the suture and give instruction on showering and other water activities.

## 2019-11-11 NOTE — Telephone Encounter (Signed)
Pt states she had surgery with Dr. Al Corpus 11/01/2019 and wanted to know if when the sutures were removed if she could get the area wet.

## 2019-11-13 NOTE — Telephone Encounter (Signed)
Appeal denied. Do you want to switch to [employer]? This was showing as covered.

## 2019-11-14 ENCOUNTER — Other Ambulatory Visit: Payer: Self-pay

## 2019-11-14 ENCOUNTER — Encounter: Payer: Self-pay | Admitting: Podiatry

## 2019-11-14 ENCOUNTER — Ambulatory Visit (INDEPENDENT_AMBULATORY_CARE_PROVIDER_SITE_OTHER): Payer: Federal, State, Local not specified - PPO | Admitting: Podiatry

## 2019-11-14 DIAGNOSIS — Z9889 Other specified postprocedural states: Secondary | ICD-10-CM

## 2019-11-14 DIAGNOSIS — T8484XA Pain due to internal orthopedic prosthetic devices, implants and grafts, initial encounter: Secondary | ICD-10-CM

## 2019-11-14 MED ORDER — AZELASTINE-FLUTICASONE 137-50 MCG/ACT NA SUSP
2.0000 | Freq: Two times a day (BID) | NASAL | 5 refills | Status: DC | PRN
Start: 2019-11-14 — End: 2020-03-03

## 2019-11-14 NOTE — Telephone Encounter (Signed)
New prescription has been sent. I also sent a mychart message sent as this form of communication was used in the past between myself and the patient.

## 2019-11-14 NOTE — Addendum Note (Signed)
Addended by: Mliss Fritz I on: 11/14/2019 01:35 PM   Modules accepted: Orders

## 2019-11-14 NOTE — Telephone Encounter (Signed)
Dymista is fine with me (2 sprays per nostril twice daily as needed).  Malachi Bonds, MD [facility]

## 2019-11-15 NOTE — Telephone Encounter (Signed)
Sure that is fine with me!   Rachella Basden, MD [facility]  

## 2019-11-17 NOTE — Progress Notes (Signed)
She presents today date of surgery 11/01/2019 removal of screw fourth toe left foot.  Objective: Vital signs are stable alert oriented x3.  Sutures are intact today margins well coapted sutures were removed margins remain well coapted there is no signs of infection.  Assessment: Well-healing surgical toe.  Plan: Follow-up with me as needed.

## 2019-11-18 DIAGNOSIS — Z79899 Other long term (current) drug therapy: Secondary | ICD-10-CM | POA: Diagnosis not present

## 2019-11-18 DIAGNOSIS — F419 Anxiety disorder, unspecified: Secondary | ICD-10-CM | POA: Diagnosis not present

## 2019-11-18 DIAGNOSIS — F902 Attention-deficit hyperactivity disorder, combined type: Secondary | ICD-10-CM | POA: Diagnosis not present

## 2019-11-18 DIAGNOSIS — F338 Other recurrent depressive disorders: Secondary | ICD-10-CM | POA: Diagnosis not present

## 2019-11-28 ENCOUNTER — Encounter: Payer: Federal, State, Local not specified - PPO | Admitting: Podiatry

## 2019-12-11 ENCOUNTER — Telehealth: Payer: Self-pay | Admitting: Podiatry

## 2019-12-11 NOTE — Telephone Encounter (Signed)
Called patient-L/M  Let her know this is a normal process of the toe healing. Reviewed signs of infection and to watch for those. Call us if it worsens

## 2019-12-11 NOTE — Telephone Encounter (Signed)
Pt called and stated that her toe still had some redness and still sore but seems to be healing. She wanted to know is this normal or should she be concerned.

## 2019-12-12 ENCOUNTER — Encounter: Payer: Federal, State, Local not specified - PPO | Admitting: Podiatry

## 2020-01-01 DIAGNOSIS — Z01419 Encounter for gynecological examination (general) (routine) without abnormal findings: Secondary | ICD-10-CM | POA: Diagnosis not present

## 2020-01-09 DIAGNOSIS — Z1231 Encounter for screening mammogram for malignant neoplasm of breast: Secondary | ICD-10-CM | POA: Diagnosis not present

## 2020-01-25 ENCOUNTER — Other Ambulatory Visit: Payer: Self-pay | Admitting: Allergy and Immunology

## 2020-02-10 DIAGNOSIS — E569 Vitamin deficiency, unspecified: Secondary | ICD-10-CM | POA: Diagnosis not present

## 2020-02-10 DIAGNOSIS — R799 Abnormal finding of blood chemistry, unspecified: Secondary | ICD-10-CM | POA: Diagnosis not present

## 2020-02-10 DIAGNOSIS — R5382 Chronic fatigue, unspecified: Secondary | ICD-10-CM | POA: Diagnosis not present

## 2020-02-10 DIAGNOSIS — E039 Hypothyroidism, unspecified: Secondary | ICD-10-CM | POA: Diagnosis not present

## 2020-02-10 DIAGNOSIS — N951 Menopausal and female climacteric states: Secondary | ICD-10-CM | POA: Diagnosis not present

## 2020-02-10 DIAGNOSIS — D649 Anemia, unspecified: Secondary | ICD-10-CM | POA: Diagnosis not present

## 2020-02-10 DIAGNOSIS — E611 Iron deficiency: Secondary | ICD-10-CM | POA: Diagnosis not present

## 2020-02-10 DIAGNOSIS — E559 Vitamin D deficiency, unspecified: Secondary | ICD-10-CM | POA: Diagnosis not present

## 2020-02-18 DIAGNOSIS — F338 Other recurrent depressive disorders: Secondary | ICD-10-CM | POA: Diagnosis not present

## 2020-02-18 DIAGNOSIS — F902 Attention-deficit hyperactivity disorder, combined type: Secondary | ICD-10-CM | POA: Diagnosis not present

## 2020-02-18 DIAGNOSIS — Z79899 Other long term (current) drug therapy: Secondary | ICD-10-CM | POA: Diagnosis not present

## 2020-02-18 DIAGNOSIS — F419 Anxiety disorder, unspecified: Secondary | ICD-10-CM | POA: Diagnosis not present

## 2020-03-03 ENCOUNTER — Other Ambulatory Visit: Payer: Self-pay

## 2020-03-03 ENCOUNTER — Encounter: Payer: Self-pay | Admitting: Allergy and Immunology

## 2020-03-03 ENCOUNTER — Ambulatory Visit (INDEPENDENT_AMBULATORY_CARE_PROVIDER_SITE_OTHER): Payer: Federal, State, Local not specified - PPO | Admitting: Family

## 2020-03-03 VITALS — BP 110/78 | HR 66 | Temp 97.3°F | Resp 12

## 2020-03-03 DIAGNOSIS — K219 Gastro-esophageal reflux disease without esophagitis: Secondary | ICD-10-CM | POA: Diagnosis not present

## 2020-03-03 DIAGNOSIS — R490 Dysphonia: Secondary | ICD-10-CM

## 2020-03-03 DIAGNOSIS — J4530 Mild persistent asthma, uncomplicated: Secondary | ICD-10-CM | POA: Diagnosis not present

## 2020-03-03 DIAGNOSIS — J3089 Other allergic rhinitis: Secondary | ICD-10-CM | POA: Diagnosis not present

## 2020-03-03 NOTE — Patient Instructions (Addendum)
Asthma Stop Flovent 44 mcg Continue montelukast 10 mg once a day to help prevent cough and wheeze Continue albuterol 2 puffs every 4 hours as needed for cough, wheeze, tightness in chest, or shortness of breath. Also may use albuterol 2 puffs 5 to 15 minutes prior to strenuous activity For respiratory flares begin Flovent 44 mcg 2 puffs twice a day with spacer for 1 to 2 weeks until symptoms returned to baseline.  Perennial and seasonal allergic rhinitis Continue montelukast milligrams as above Continue XHANCE 2 sprays each nostril twice a day as needed for stuffy nose Stop Xyzal (levocetirizine) due to drowsiness Continue Claritin 10 mg once a day as needed for runny nose Consider saline nasal spray or saline nasal rinse as needed for nasal symptoms. Use this prior to any medicated nasal sprays  Acid reflux Continue appropriate lifestyle and dietary modifications. If needed add famotidine 20 mg 1-2 times a day  Hoarseness If using the famotidine 1-2 times a day does not help hoarseness schedule an appointment with Dr. Suszanne Conners.  Schedule an appointment with your primary care physician to discuss palpitations  Please let us know if this treatment plan is not working well for you Schedule a follow-up appointment in 3 months

## 2020-03-03 NOTE — Progress Notes (Deleted)
Follow-up Note  RE: Wendy Shelton MRN: [id number] DOB: 12/01/1970 Date of Office Visit: 03/03/2020  Primary care provider: Lorenda Ishihara, MD Referring provider: Lorenda Shelton,*  History of present illness: Wendy Shelton  xhance in the spring  GG max streng  xzal "knocked her out" uses Calritin instaead  pesistne asthma - flovent 44 prn - a few times per week and monte daily No alb over the past month occ dry cough with bike riding, ct and sob with stress  Hoarse with a lot of talking - off and on for a few.  Acid reflux - pepcid as needed with etoh, rich foods   HA ? ADHD meds  Assessment and plan: No problem-specific Assessment & Plan notes found for this encounter.   No orders of the defined types were placed in this encounter.   Diagnostics: ***    Physical examination: Blood pressure 110/78, pulse 66, temperature (!) 97.3 F (36.3 C), temperature source Temporal, resp. rate 12, SpO2 100 %.  General: Alert, interactive, in no acute distress. HEENT: TMs pearly gray, turbinates {Blank single:19197::"non-edematous","edematous","edematous and pale","markedly edematous","markedly edematous and pale","moderately edematous","mildly edematous","minimally edematous"} {Blank single:19197::"with crusty discharge","with thick discharge","with clear discharge","without discharge"}, post-pharynx {Blank single:19197::"non erythematous","erythematous","markedly erythematous","moderately erythematous","mildly erythematous","unremarkable"}. Neck: Supple without lymphadenopathy. Lungs: {Blank single:19197::"Upper airway sounds transmitted","Bronchial sounds auscultated bilaterally","Decreased breath sounds with expiratory wheezing bilaterally","Mildly decreased breath sounds with expiratory wheezing bilaterally","Decreased breath sounds bilaterally without wheezing, rhonchi or rales","Mildly decreased breath sounds bilaterally without wheezing, rhonchi or  rales","Clear to auscultation without wheezing, rhonchi or rales"}. CV: Normal S1, S2 without murmurs. Skin: {Blank single:19197::"Dry, erythematous, excoriated patches on the ***","Dry, hyperpigmented, thickened patches on the ***","Dry, mildly hyperpigmented, mildly thickened patches on the ***","Scattered erythematous urticarial type lesions primarily located *** , nonvesicular","Warm and dry, without lesions or rashes"}.  {Common ambulatory SmartLinks:19316::" "}  Current Outpatient Medications  Medication Sig Dispense Refill  . albuterol (VENTOLIN HFA) 108 (90 Base) MCG/ACT inhaler Inhale 1-2 puffs into the lungs every 4 (four) hours as needed for wheezing or shortness of breath. 18 g 1  . ALPRAZolam (XANAX) 0.5 MG tablet Take 0.5 mg by mouth as needed.     Marland Kitchen dexmethylphenidate (FOCALIN XR) 20 MG 24 hr capsule Take 15 mg by mouth daily.   0  . Diclofenac Sodium (PENNSAID) 2 % SOLN Apply 2 g topically 2 (two) times daily as needed (to affected area). 112 g 3  . fluticasone (FLOVENT HFA) 44 MCG/ACT inhaler Inhale 2 puffs into the lungs 2 (two) times daily. (Patient taking differently: Inhale 2 puffs into the lungs daily. ) 1 Inhaler 5  . Fluticasone Propionate (XHANCE) 93 MCG/ACT EXHU Place 2 sprays into the nose 2 (two) times daily as needed. 16 mL 3  . guanFACINE (INTUNIV) 1 MG TB24 ER tablet TK 1 T PO BID  2  . montelukast (SINGULAIR) 10 MG tablet TAKE 1 TABLET(10 MG) BY MOUTH AT BEDTIME 90 tablet 0  . NP THYROID 60 MG tablet Take 60 mg by mouth daily.    . progesterone (PROMETRIUM) 100 MG capsule Take 100 mg by mouth at bedtime.    . valACYclovir (VALTREX) 1000 MG tablet TAKE 2 TABLETS BY MOUTH 2 TIMES DAILY FOR 3-7 DAYS AS NEEDED  0  . fluorouracil (EFUDEX) 5 % cream APPLY TOPICALLY TO THE AFFECTED AREA TWICE DAILY FOR 14 DAYS (Patient not taking: Reported on 03/03/2020)     No current facility-administered medications for this visit.    Allergies  Allergen Reactions  . Penicillins  Hives  . Amoxicillin     rash  . Keflex [Cephalexin]     rash  . Sulfa Antibiotics     Rash     I appreciate the opportunity to take part in Wendy Shelton's care. Please do not hesitate to contact me with questions.  Sincerely,   R. Jorene Guest, MD

## 2020-03-03 NOTE — Progress Notes (Addendum)
[address] [place] [address] Dept: [phone]  FOLLOW UP NOTE  Patient ID: Wendy Shelton, female    DOB: 1970/07/21  Age: 49 y.o. MRN: [id number] Date of Office Visit: 03/03/2020  Assessment  Chief Complaint: Asthma  HPI Wendy Shelton is a 49 year old female who presents today for follow-up of moderate persistent asthma, perennial and seasonal allergic rhinitis, acid reflux, and hoarseness.  She was last seen on Sep 30, 2019 by Dr. Nunzio Cobbs.  Moderate persistent asthma is reported as moderately controlled with Flovent 44 mcg as needed, montelukast 10 mg once a day, and albuterol as needed.  She uses Flovent 44 mcg a couple puffs a few times a week.  She reports occasional dry cough that at times will be productive with white sputum after riding her bike.  She will also have tightness in her chest and shortness of breath with stress.  She denies any wheezing and nocturnal awakenings.  Since her last office visit she has not required any systemic steroids or made any trips to the emergency room or urgent care due to breathing problems.  She has not used her albuterol any in the past month. She also had concerns about Flovent causing her to stay awake at night if she took the evening even though it had not done this in the past.  Perennial and seasonal allergic rhinitis is reported as moderately controlled with montelukast 10 mg once a day, XHANCE as needed, and Claritin 10 mg once a day as needed.  She reports that Xyzal knocked her out and even half the dose "zapped her out" out.  She likes Claritin and is taking this just as needed.  She reports occasional nasal congestion and denies any rhinorrhea, postnasal drip, and itchy watery eyes.  She did mention that she has an occasional headache but wonders if this is due to her ADD medications or stress.  Reflux is reported as moderately controlled with Alka-Seltzer as needed and famotidine as needed.  She will  have reflux after eating rich foods, chocolate, drinking, or exercising.  She continues to have occasional hoarseness.  She notices that when she is talking a lot that her voice is strained.  She reports that this started a couple years ago when she started using an inhaler.  She has not seen ENT in the past for hoarseness.  She reports palpitations and a sensation of feeling like her blood pressure is high.  At the times that her blood pressure feels high she does not have a blood pressure cuff at home.  Current medications are as listed in the chart.   Drug Allergies:  Allergies  Allergen Reactions  . Penicillins Hives  . Amoxicillin     rash  . Keflex [Cephalexin]     rash  . Sulfa Antibiotics     Rash     Review of Systems: Review of Systems  Constitutional: Negative for chills and fever.  HENT:       Reports occasional nasal congestion and denies any rhinorrhea, and post nasal drip  Eyes:       Denies itchy watery eyes  Respiratory: Positive for cough and shortness of breath. Negative for wheezing.   Cardiovascular: Positive for palpitations. Negative for chest pain.  Gastrointestinal: Negative for abdominal pain and heartburn.  Genitourinary: Negative for dysuria.  Skin: Negative for itching and rash.  Neurological: Positive for headaches.  Endo/Heme/Allergies: Positive for environmental allergies.    Physical Exam: BP 110/78   Pulse  66   Temp (!) 97.3 F (36.3 C) (Temporal)   Resp 12   SpO2 100%    Physical Exam Constitutional:      Appearance: Normal appearance.  HENT:     Head: Normocephalic and atraumatic.     Comments: Pharynx normal. Eyes normal. Ears normal. Nose normal.    Right Ear: Tympanic membrane, ear canal and external ear normal.     Left Ear: Tympanic membrane, ear canal and external ear normal.     Nose: Nose normal.     Mouth/Throat:     Mouth: Mucous membranes are moist.     Pharynx: Oropharynx is clear.  Eyes:      Conjunctiva/sclera: Conjunctivae normal.  Cardiovascular:     Rate and Rhythm: Normal rate and regular rhythm.     Heart sounds: Normal heart sounds.  Pulmonary:     Effort: Pulmonary effort is normal.     Breath sounds: Normal breath sounds.     Comments: Lungs clear to auscultation Musculoskeletal:     Cervical back: Neck supple.  Skin:    General: Skin is warm.  Neurological:     Mental Status: She is alert and oriented to person, place, and time.  Psychiatric:        Mood and Affect: Mood normal.        Behavior: Behavior normal.        Thought Content: Thought content normal.        Judgment: Judgment normal.     Diagnostics: FVC 3.27 L, FEV1 2.71 L.  Predicted FVC 3.96 L, FEV1 3.16 L.  Spirometry indicates normal ventilatory function.  Assessment and Plan: 1. Mild persistent asthma, unspecified whether complicated   2. Perennial and seasonal allergic rhinitis   3. Gastroesophageal reflux disease, unspecified whether esophagitis present   4. Hoarseness     No orders of the defined types were placed in this encounter.   Patient Instructions  Asthma Stop Flovent 44 mcg Continue montelukast 10 mg once a day to help prevent cough and wheeze Continue albuterol 2 puffs every 4 hours as needed for cough, wheeze, tightness in chest, or shortness of breath. Also may use albuterol 2 puffs 5 to 15 minutes prior to strenuous activity For respiratory flares begin Flovent 44 mcg 2 puffs twice a day with spacer for 1 to 2 weeks until symptoms returned to baseline.  Perennial and seasonal allergic rhinitis Continue montelukast milligrams as above Continue XHANCE 2 sprays each nostril twice a day as needed for stuffy nose Stop Xyzal (levocetirizine) due to drowsiness Continue Claritin 10 mg once a day as needed for runny nose Consider saline nasal spray or saline nasal rinse as needed for nasal symptoms. Use this prior to any medicated nasal sprays  Acid reflux Continue  appropriate lifestyle and dietary modifications. If needed add famotidine 20 mg 1-2 times a day  Hoarseness If using the famotidine 1-2 times a day does not help hoarseness schedule an appointment with Dr. Suszanne Conners.  Schedule an appointment with your primary care physician to discuss palpitations  Please let us know if this treatment plan is not working well for you Schedule a follow-up appointment in 3 months    Return in about 3 months (around 06/03/2020), or if symptoms worsen or fail to improve.    Thank you for the opportunity to care for this patient.  Please do not hesitate to contact me with questions.  Nehemiah Settle, FNP [facility]  ________________________________________________  I have provided oversight concerning Wynona Canes Larenzo Caples's evaluation and treatment of this patient's health issues addressed during today's encounter.  I agree with the assessment and therapeutic plan as outlined in the note.   Signed,   R Jorene Guest, MD

## 2020-03-06 NOTE — Addendum Note (Signed)
Addended by: Janie Morning on: 03/06/2020 10:24 AM   Modules accepted: Orders

## 2020-04-29 ENCOUNTER — Other Ambulatory Visit: Payer: Self-pay | Admitting: Allergy and Immunology

## 2020-04-30 DIAGNOSIS — E559 Vitamin D deficiency, unspecified: Secondary | ICD-10-CM | POA: Diagnosis not present

## 2020-04-30 DIAGNOSIS — Z13220 Encounter for screening for lipoid disorders: Secondary | ICD-10-CM | POA: Diagnosis not present

## 2020-04-30 DIAGNOSIS — Z0000 Encounter for general adult medical examination without abnormal findings: Secondary | ICD-10-CM | POA: Diagnosis not present

## 2020-05-19 DIAGNOSIS — F419 Anxiety disorder, unspecified: Secondary | ICD-10-CM | POA: Diagnosis not present

## 2020-05-19 DIAGNOSIS — F338 Other recurrent depressive disorders: Secondary | ICD-10-CM | POA: Diagnosis not present

## 2020-05-19 DIAGNOSIS — Z79899 Other long term (current) drug therapy: Secondary | ICD-10-CM | POA: Diagnosis not present

## 2020-05-19 DIAGNOSIS — F902 Attention-deficit hyperactivity disorder, combined type: Secondary | ICD-10-CM | POA: Diagnosis not present

## 2020-06-03 ENCOUNTER — Other Ambulatory Visit: Payer: Federal, State, Local not specified - PPO

## 2020-06-19 DIAGNOSIS — F901 Attention-deficit hyperactivity disorder, predominantly hyperactive type: Secondary | ICD-10-CM | POA: Diagnosis not present

## 2020-06-26 DIAGNOSIS — F901 Attention-deficit hyperactivity disorder, predominantly hyperactive type: Secondary | ICD-10-CM | POA: Diagnosis not present

## 2020-07-03 DIAGNOSIS — F902 Attention-deficit hyperactivity disorder, combined type: Secondary | ICD-10-CM | POA: Diagnosis not present

## 2020-07-07 ENCOUNTER — Ambulatory Visit: Payer: Federal, State, Local not specified - PPO | Admitting: Allergy and Immunology

## 2020-07-24 DIAGNOSIS — F902 Attention-deficit hyperactivity disorder, combined type: Secondary | ICD-10-CM | POA: Diagnosis not present

## 2020-07-31 DIAGNOSIS — F902 Attention-deficit hyperactivity disorder, combined type: Secondary | ICD-10-CM | POA: Diagnosis not present

## 2020-08-04 ENCOUNTER — Other Ambulatory Visit: Payer: Self-pay

## 2020-08-04 ENCOUNTER — Ambulatory Visit: Payer: Federal, State, Local not specified - PPO | Admitting: Allergy and Immunology

## 2020-08-04 VITALS — BP 124/80 | HR 63 | Temp 98.4°F | Resp 18 | Ht 67.0 in | Wt 206.0 lb

## 2020-08-04 DIAGNOSIS — J4520 Mild intermittent asthma, uncomplicated: Secondary | ICD-10-CM

## 2020-08-04 DIAGNOSIS — K219 Gastro-esophageal reflux disease without esophagitis: Secondary | ICD-10-CM

## 2020-08-04 DIAGNOSIS — J3089 Other allergic rhinitis: Secondary | ICD-10-CM

## 2020-08-04 MED ORDER — FAMOTIDINE 20 MG PO TABS: 20.0000 mg | ORAL_TABLET | Freq: Every day | ORAL | 5 refills | Status: DC

## 2020-08-04 MED ORDER — ALBUTEROL SULFATE HFA 108 (90 BASE) MCG/ACT IN AERS: 1.0000 | INHALATION_SPRAY | RESPIRATORY_TRACT | 1 refills | Status: DC | PRN

## 2020-08-04 MED ORDER — OMEPRAZOLE MAGNESIUM 20 MG PO TBEC: 20.0000 mg | DELAYED_RELEASE_TABLET | Freq: Every day | ORAL | 5 refills | Status: DC

## 2020-08-04 NOTE — Progress Notes (Signed)
[place]   Follow-up Note  Referring Provider: Lorenda Ishihara,* Primary Provider: Lorenda Ishihara, MD Date of Office Visit: 08/04/2020  Subjective:   Wendy Shelton (DOB: [birth year]) is a 50 y.o. female who [facility]2020 in re-evaluation of the following:  HPI: Wendy Shelton returns to this clinic in evaluation of asthma and allergic rhinitis and LPR.  I have never seen her in this clinic and her last visit with our nurse practitioner was 03 March 2020.  She believes that asthma is not a particularly big issue at this point in time.  She rarely uses a short acting bronchodilator and is no longer using any Flovent and she can exercise without any difficulty.  She does continue on Singulair on a regular basis.  She has not required a systemic steroid to treat an exacerbation of asthma and over a year.  She does have a history of allergic rhinoconjunctivitis for which she has been using Flonase.  Overall she feels that her upper airway issue was doing relatively well.  She also had a history of reflux and LPR that she intermittently treats with some famotidine but this is relatively rare.  Since she has lost about 50 pounds of weight she does not have her regurgitation.  She drinks 1 coffee in the morning and she has chocolate on a daily basis.  What is new for Wendy Shelton is the fact that in January she contracted a viral infection, as did her husband who was positive for COVID, as did several immediate contacts who are positive for COVID.  [name] had a swab performed and she was Covid negative.  But she had this lingering infection that involved her head and her throat for weeks.  Ever since that point in time she has continued to have lots of throat clearing and lots of phlegm in her throat.  She has also had some right ear fullness without any hearing loss or tinnitus or dizziness.  She can  smell and taste without any problem.  She has not been having any associated coughing.  Allergies as of 08/04/2020      Reactions   Penicillins Hives   Amoxicillin    rash   Keflex [cephalexin]    rash   Sulfa Antibiotics    Rash      Medication List      albuterol 108 (90 Base) MCG/ACT inhaler Commonly known as: VENTOLIN HFA Inhale 1-2 puffs into the lungs every 4 (four) hours as needed for wheezing or shortness of breath.   ALPRAZolam 0.5 MG tablet Commonly known as: XANAX Take 0.5 mg by mouth as needed.   dexmethylphenidate 20 MG 24 hr capsule Commonly known as: FOCALIN XR Take 15 mg by mouth daily.   Diclofenac Sodium 2 % Soln Commonly known as: Pennsaid Apply 2 g topically 2 (two) times daily as needed (to affected area).   Flovent HFA 44 MCG/ACT inhaler Generic drug: fluticasone Inhale 2 puffs into the lungs 2 (two) times daily.   fluorouracil 5 % cream Commonly known as: EFUDEX APPLY TOPICALLY TO THE AFFECTED AREA TWICE DAILY FOR 14 DAYS   guanFACINE 1 MG Tb24 ER tablet Commonly known as: INTUNIV TK 1 T PO BID   montelukast 10 MG tablet Commonly known as: SINGULAIR TAKE 1 TABLET(10 MG) BY MOUTH AT BEDTIME   NP Thyroid 60 MG tablet Generic drug: thyroid Take 60 mg by mouth daily.   progesterone  100 MG capsule Commonly known as: PROMETRIUM Take 100 mg by mouth at bedtime.   valACYclovir 1000 MG tablet Commonly known as: VALTREX TAKE 2 TABLETS BY MOUTH 2 TIMES DAILY FOR 3-7 DAYS AS NEEDED       Past Medical History:  Diagnosis Date  . ADHD   . Anxiety   . Asthma     Past Surgical History:  Procedure Laterality Date  . FOOT SURGERY     hammer toe and bunion    Review of systems negative except as noted in HPI / PMHx or noted below:  Review of Systems  Constitutional: Negative.   HENT: Negative.   Eyes: Negative.   Respiratory: Negative.   Cardiovascular: Negative.   Gastrointestinal: Negative.   Genitourinary: Negative.    Musculoskeletal: Negative.   Skin: Negative.   Neurological: Negative.   Endo/Heme/Allergies: Negative.   Psychiatric/Behavioral: Negative.      Objective:   Vitals:   08/04/20 1040  BP: 124/80  Pulse: 63  Resp: 18  Temp: 98.4 F (36.9 C)  SpO2: 99%   Height: 5\' 7"  (170.2 cm)  Weight: 206 lb (93.4 kg)   Physical Exam Constitutional:      Appearance: She is not diaphoretic.     Comments: Throat clearing  HENT:     Head: Normocephalic.     Right Ear: Ear canal and external ear normal. A middle ear effusion (Lack of pneumatic movement) is present.     Left Ear: Tympanic membrane, ear canal and external ear normal.     Nose: Nose normal. No mucosal edema or rhinorrhea.     Mouth/Throat:     Pharynx: Uvula midline. No oropharyngeal exudate.  Eyes:     Conjunctiva/sclera: Conjunctivae normal.  Neck:     Thyroid: No thyromegaly.     Trachea: Trachea normal. No tracheal tenderness or tracheal deviation.  Cardiovascular:     Rate and Rhythm: Normal rate and regular rhythm.     Heart sounds: Normal heart sounds, S1 normal and S2 normal. No murmur heard.   Pulmonary:     Effort: No respiratory distress.     Breath sounds: Normal breath sounds. No stridor. No wheezing or rales.  Lymphadenopathy:     Head:     Right side of head: No tonsillar adenopathy.     Left side of head: No tonsillar adenopathy.     Cervical: No cervical adenopathy.  Skin:    Findings: No erythema or rash.     Nails: There is no clubbing.  Neurological:     Mental Status: She is alert.     Diagnostics:    Spirometry was performed and demonstrated an FEV1 of 2.82 at 89 % of predicted.  Assessment and Plan:   1. Asthma, mild intermittent, well-controlled   2. Perennial and seasonal allergic rhinitis   3. LPRD (laryngopharyngeal reflux disease)     1.  Treat and prevent inflammation:   A. Montelukast 10 mg - 1 tablet 1 time per day  B. Flonase - 1-2 sprays each nostril 1 time per  day  C. Prednisone 10 mg - 1 tablet 1 time per day for 10 days only  2.  Treat and prevent reflux/LPR:   A. Consolidate all forms of caffeine including chocolate  B. Omeprazole 40 mg - 1 tablet 1 time per day in AM  C. Famotidine 40 mg - 1 tablet 1 time per day in PM  D. Replace throat clearing with swallowing maneuver  3.  If needed:   A. OTC antihistamine  B. Albuterol HFA - 2 inhalations every 4-6 hours  4.  Return to clinic in 4 weeks or earlier if problem  Wendy Shelton appears to have an insult to her mid airway which is probably a reflection of her recent viral infection, most likely Covid, and her inadequately treated LPR.  I am going to give her some anti-inflammatory medications for her airway as noted above and also treat her reflux a little more aggressively with the plan noted above.  I will regroup with her in 4 weeks to assess her response.  Further evaluation and treatment will based upon this response.  Laurette Schimke, MD Allergy / Immunology [facility]

## 2020-08-04 NOTE — Patient Instructions (Signed)
  1.  Treat and prevent inflammation:   A. Montelukast 10 mg - 1 tablet 1 time per day  B. Flonase - 1-2 sprays each nostril 1 time per day  C. Prednisone 10 mg - 1 tablet 1 time per day for 10 days only  2.  Treat and prevent reflux/LPR:   A. Consolidate all forms of caffeine including chocolate  B. Omeprazole 40 mg - 1 tablet 1 time per day in AM  C. Famotidine 40 mg - 1 tablet 1 time per day in PM  D. Replace throat clearing with swallowing maneuver  3.  If needed:   A. OTC antihistamine  B. Albuterol HFA - 2 inhalations every 4-6 hours  4.  Return to clinic in 4 weeks or earlier if problem

## 2020-08-05 ENCOUNTER — Encounter: Payer: Self-pay | Admitting: Allergy and Immunology

## 2020-08-07 ENCOUNTER — Other Ambulatory Visit: Payer: Self-pay | Admitting: Family

## 2020-08-07 DIAGNOSIS — F902 Attention-deficit hyperactivity disorder, combined type: Secondary | ICD-10-CM | POA: Diagnosis not present

## 2020-08-07 DIAGNOSIS — F419 Anxiety disorder, unspecified: Secondary | ICD-10-CM | POA: Diagnosis not present

## 2020-08-12 DIAGNOSIS — F419 Anxiety disorder, unspecified: Secondary | ICD-10-CM | POA: Diagnosis not present

## 2020-08-13 DIAGNOSIS — F419 Anxiety disorder, unspecified: Secondary | ICD-10-CM | POA: Diagnosis not present

## 2020-08-17 DIAGNOSIS — F338 Other recurrent depressive disorders: Secondary | ICD-10-CM | POA: Diagnosis not present

## 2020-08-17 DIAGNOSIS — F902 Attention-deficit hyperactivity disorder, combined type: Secondary | ICD-10-CM | POA: Diagnosis not present

## 2020-08-17 DIAGNOSIS — F419 Anxiety disorder, unspecified: Secondary | ICD-10-CM | POA: Diagnosis not present

## 2020-08-17 DIAGNOSIS — Z79899 Other long term (current) drug therapy: Secondary | ICD-10-CM | POA: Diagnosis not present

## 2020-09-22 ENCOUNTER — Other Ambulatory Visit: Payer: Self-pay

## 2020-09-22 ENCOUNTER — Encounter: Payer: Self-pay | Admitting: Allergy and Immunology

## 2020-09-22 ENCOUNTER — Ambulatory Visit: Payer: Federal, State, Local not specified - PPO | Admitting: Allergy and Immunology

## 2020-09-22 VITALS — BP 120/80 | HR 59 | Resp 16 | Ht 67.0 in | Wt 202.0 lb

## 2020-09-22 DIAGNOSIS — J3089 Other allergic rhinitis: Secondary | ICD-10-CM

## 2020-09-22 DIAGNOSIS — J4520 Mild intermittent asthma, uncomplicated: Secondary | ICD-10-CM

## 2020-09-22 DIAGNOSIS — K219 Gastro-esophageal reflux disease without esophagitis: Secondary | ICD-10-CM | POA: Diagnosis not present

## 2020-09-22 NOTE — Patient Instructions (Addendum)
  1.  Treat and prevent inflammation:   A. Montelukast 10 mg - 1 tablet 1 time per day  B. Flonase - 1-2 sprays each nostril 1 time per day  2.  Continue to treat and prevent reflux/LPR:   A. Consolidate all forms of caffeine including chocolate  B. Omeprazole 40 mg - 1 tablet 1 time per day in AM  C. DISCONTINUE Famotidine    D. Replace throat clearing with swallowing maneuver  3.  If needed:   A. OTC antihistamine  B. Albuterol HFA - 2 inhalations every 4-6 hours  4.  Return to clinic in 8 weeks or earlier if problem

## 2020-09-22 NOTE — Progress Notes (Signed)
[place] [facility] [place] [facility] [place] [facility] [place] [facility] [place]   Follow-up Note  Referring Provider: Lorenda Ishihara,* Primary Provider: Lorenda Ishihara, MD Date of Office Visit: 09/22/2020  Subjective:   Wendy Shelton (DOB: 07/23/1970) is a 50 y.o. female who [facility] in re-evaluation of the following:  HPI: Wendy Shelton returns to this clinic in evaluation of asthma and allergic rhinitis and LPR.  Her last visit to this clinic was 04 August 2020.  During her last visit we established a plan that addressed inflammation of her airway and reflux induced respiratory disease.  She has improved significantly.  Her head has cleared and her throat has cleared.  Occasionally she has some phlegm in her throat but most of her throat clearing has been eliminated and her ears are feeling good.  She might have a little bit of a throat clearing cough on occasion but this has decreased dramatically.  She has no need to use albuterol.  Occasionally she has some anxiety issues.  She gets chest tightness and shortness of breath and just feels kind of anxious and she can usually talk her way out of the sensation by doing some relaxation and deep breathing.  She has tried some Xanax in the past but she had an adverse response regarding development of nightmares.  She has dramatically consolidated her caffeine and chocolate consumption.  Allergies as of 09/22/2020      Reactions   Penicillins Hives   Amoxicillin    rash   Keflex [cephalexin]    rash   Sulfa Antibiotics    Rash      Medication List    albuterol 108 (90 Base) MCG/ACT inhaler Commonly known as: VENTOLIN HFA Inhale 1-2 puffs into the lungs every 4 (four) hours as needed for wheezing or shortness of breath.   ALPRAZolam 0.5 MG tablet Commonly known as: XANAX Take 0.5 mg by mouth as needed.   dexmethylphenidate 20 MG 24 hr capsule Commonly known as: FOCALIN XR Take 15 mg by  mouth daily.   guanFACINE 1 MG Tb24 ER tablet Commonly known as: INTUNIV TK 1 T PO BID   loratadine 10 MG tablet Commonly known as: CLARITIN 1 tablet   montelukast 10 MG tablet Commonly known as: SINGULAIR TAKE 1 TABLET(10 MG) BY MOUTH AT BEDTIME   NP Thyroid 60 MG tablet Generic drug: thyroid Take 60 mg by mouth daily.   omeprazole 20 MG tablet Commonly known as: PriLOSEC OTC Take 1 tablet (20 mg total) by mouth daily.   progesterone 100 MG capsule Commonly known as: PROMETRIUM Take 100 mg by mouth at bedtime.   valACYclovir 1000 MG tablet Commonly known as: VALTREX TAKE 2 TABLETS BY MOUTH 2 TIMES DAILY FOR 3-7 DAYS AS NEEDED       Past Medical History:  Diagnosis Date  . ADHD   . Anxiety   . Asthma     Past Surgical History:  Procedure Laterality Date  . FOOT SURGERY     hammer toe and bunion    Review of systems negative except as noted in HPI / PMHx or noted below:  Review of Systems  Constitutional: Negative.   HENT: Negative.   Eyes: Negative.   Respiratory: Negative.   Cardiovascular: Negative.   Gastrointestinal: Negative.   Genitourinary: Negative.   Musculoskeletal: Negative.   Skin: Negative.   Neurological: Negative.   Endo/Heme/Allergies: Negative.   Psychiatric/Behavioral: Negative.      Objective:   Vitals:  09/22/20 1638  BP: 120/80  Pulse: (!) 59  Resp: 16  SpO2: 99%   Height: 5\' 7"  (170.2 cm)  Weight: 202 lb (91.6 kg)   Physical Exam Constitutional:      Appearance: She is not diaphoretic.  HENT:     Head: Normocephalic.     Right Ear: Tympanic membrane, ear canal and external ear normal.     Left Ear: Tympanic membrane, ear canal and external ear normal.     Nose: Nose normal. No mucosal edema or rhinorrhea.     Mouth/Throat:     Pharynx: Uvula midline. No oropharyngeal exudate.  Eyes:     Conjunctiva/sclera: Conjunctivae normal.  Neck:     Thyroid: No thyromegaly.     Trachea: Trachea normal. No tracheal  tenderness or tracheal deviation.  Cardiovascular:     Rate and Rhythm: Normal rate and regular rhythm.     Heart sounds: Normal heart sounds, S1 normal and S2 normal. No murmur heard.   Pulmonary:     Effort: No respiratory distress.     Breath sounds: Normal breath sounds. No stridor. No wheezing or rales.  Lymphadenopathy:     Head:     Right side of head: No tonsillar adenopathy.     Left side of head: No tonsillar adenopathy.     Cervical: No cervical adenopathy.  Skin:    Findings: No erythema or rash.     Nails: There is no clubbing.  Neurological:     Mental Status: She is alert.     Diagnostics:    Spirometry was performed and demonstrated an FEV1 of 2.42 at 78 % of predicted.  The patient had an Asthma Control Test with the following results: ACT Total Score: 22.    Assessment and Plan:   1. Asthma, mild intermittent, well-controlled   2. Perennial and seasonal allergic rhinitis   3. LPRD (laryngopharyngeal reflux disease)     1.  Treat and prevent inflammation:   A. Montelukast 10 mg - 1 tablet 1 time per day  B. Flonase - 1-2 sprays each nostril 1 time per day  2.  Continue to treat and prevent reflux/LPR:   A. Consolidate all forms of caffeine including chocolate  B. Omeprazole 40 mg - 1 tablet 1 time per day in AM  C. DISCONTINUE Famotidine    D. Replace throat clearing with swallowing maneuver  3.  If needed:   A. OTC antihistamine  B. Albuterol HFA - 2 inhalations every 4-6 hours  4.  Return to clinic in 8 weeks or earlier if problem  is definitely doing a lot better with a combination of anti-inflammatory agents for her airway and therapy directed against reflux.  There may be an opportunity to consolidate some of her treatment and she can discontinue her famotidine.  I will see her back in his clinic in 8 weeks and there may be an opportunity to further consolidate her treatment with that visit.  Wendy Grater, MD Allergy /  Immunology [facility]

## 2020-09-23 ENCOUNTER — Encounter: Payer: Self-pay | Admitting: Allergy and Immunology

## 2020-11-09 ENCOUNTER — Other Ambulatory Visit: Payer: Self-pay | Admitting: Allergy and Immunology

## 2020-11-17 ENCOUNTER — Ambulatory Visit: Payer: Federal, State, Local not specified - PPO | Admitting: Allergy and Immunology

## 2020-12-01 ENCOUNTER — Other Ambulatory Visit: Payer: Self-pay

## 2020-12-01 ENCOUNTER — Ambulatory Visit: Payer: Self-pay

## 2020-12-01 ENCOUNTER — Ambulatory Visit: Payer: Federal, State, Local not specified - PPO | Admitting: Orthopaedic Surgery

## 2020-12-01 ENCOUNTER — Encounter: Payer: Self-pay | Admitting: Orthopaedic Surgery

## 2020-12-01 DIAGNOSIS — G8929 Other chronic pain: Secondary | ICD-10-CM

## 2020-12-01 DIAGNOSIS — M25561 Pain in right knee: Secondary | ICD-10-CM | POA: Diagnosis not present

## 2020-12-01 DIAGNOSIS — M25562 Pain in left knee: Secondary | ICD-10-CM | POA: Diagnosis not present

## 2020-12-01 NOTE — Progress Notes (Signed)
Office Visit Note   Patient: Wendy Shelton           Date of Birth: 31-Jan-1971           MRN: [id number] Visit Date: 12/01/2020              Requested by: Lorenda Ishihara, MD [address] [place],  [place] [address] PCP: Lorenda Ishihara, MD   Assessment & Plan: Visit Diagnoses:  1. Chronic pain of left knee   2. Chronic pain of right knee     Plan: Based on findings of the left knee is consistent with osteoarthritis that is still relatively mild.  She will continue with over-the-counter medications.  For the right knee due to the trauma that she previously had the tissue layers likely scarred together.  I do not see any problems with the skin and I do not recommend any surgical intervention.  I think the hyperextension sensation that she is getting is due to patellofemoral disease.  She will continue to work on weight loss and quadricep strengthening.  We will see her back as needed.  Follow-Up Instructions: Return if symptoms worsen or fail to improve.   Orders:  Orders Placed This Encounter  Procedures   XR KNEE 3 VIEW LEFT   XR KNEE 3 VIEW RIGHT   No orders of the defined types were placed in this encounter.     Procedures: No procedures performed   Clinical Data: No additional findings.   Subjective: Chief Complaint  Patient presents with   Left Knee - Pain   Right Knee - Pain    Wendy Shelton is a 50 year old female here for evaluation of bilateral knee pain.  We saw her about 2 years ago for right knee atraumatic prepatellar bursitis that I removed 155 cc of traumatic fluid from.  She states that she still has some permanent numbness and tingling around the anterior aspect of the knee.  She states that the skin has a weird appearance compared to the left knee.  She is quite active and exercises every day to try to lose weight and stay active.  For the left knee she has been having start up pain and stiffness that is worse with activity.   She has had formal physical therapy but not had any injections.  Denies any injuries or previous surgeries to the left knee.   Review of Systems  Constitutional: Negative.   HENT: Negative.    Eyes: Negative.   Respiratory: Negative.    Cardiovascular: Negative.   Endocrine: Negative.   Musculoskeletal: Negative.   Neurological: Negative.   Hematological: Negative.   Psychiatric/Behavioral: Negative.    All other systems reviewed and are negative.   Objective: Vital Signs: There were no vitals taken for this visit.  Physical Exam Vitals and nursing note reviewed.  Constitutional:      Appearance: She is well-developed.  Pulmonary:     Effort: Pulmonary effort is normal.  Skin:    General: Skin is warm.     Capillary Refill: Capillary refill takes less than 2 seconds.  Neurological:     Mental Status: She is alert and oriented to person, place, and time.  Psychiatric:        Behavior: Behavior normal.        Thought Content: Thought content normal.        Judgment: Judgment normal.    Ortho Exam Right knee shows a excess skin on the anterior aspect of the right knee.  Appears to be tethered to the underlying subcutaneous tissue.  There is no sign of infection.  Normal range of motion.  Collaterals and cruciates are stable.  No joint effusion.  No evidence of hyperextension.  Left knee shows mild patellofemoral crepitus.  Exam is unchanged from prior evaluation. Specialty Comments:  No specialty comments available.  Imaging: XR KNEE 3 VIEW LEFT  Result Date: 12/01/2020 Mild degenerative changes to the femoral-tibial compartments.  XR KNEE 3 VIEW RIGHT  Result Date: 12/01/2020 No acute or structural abnormalities    PMFS History: Patient Active Problem List   Diagnosis Date Noted   Hoarseness 09/30/2019   Other forms of dyspnea 04/23/2018   Acid reflux 09/05/2017   Cough 02/28/2017   Drug allergy, antibiotic 10/26/2016   Perennial and seasonal allergic  rhinitis 10/26/2016   Rosacea 10/26/2016   History of food allergy 10/26/2016   Moderate persistent asthma 10/26/2016   Past Medical History:  Diagnosis Date   ADHD    Anxiety    Asthma     Family History  Problem Relation Age of Onset   Allergic rhinitis Mother    Asthma Brother    Angioedema Neg Hx    Eczema Neg Hx    Immunodeficiency Neg Hx    Urticaria Neg Hx     Past Surgical History:  Procedure Laterality Date   FOOT SURGERY     hammer toe and bunion   Social History   Occupational History   Not on file  Tobacco Use   Smoking status: Former    Packs/day: 0.25    Years: 15.00    Pack years: 3.75    Types: Cigarettes   Smokeless tobacco: Never  Vaping Use   Vaping Use: Never used  Substance and Sexual Activity   Alcohol use: Yes   Drug use: No   Sexual activity: Not on file

## 2020-12-04 IMAGING — MR MR SHOULDER*R* W/O CM
5 series · 36 of 40 positions shown · non-contrast
Comparison: None.

CLINICAL DATA: Right shoulder pain and weakness for the past 6
months.

EXAM:
MRI OF THE RIGHT SHOULDER WITHOUT CONTRAST
TECHNIQUE: Multiplanar, multisequence MR imaging of the shoulder was performed.
No intravenous contrast was administered.

[Series 3: PD fat-sat · axial · 4.0mm · 0.55mm/px · z∈[-3,+81]mm · 8 of 20 slices shown (1 of 2)]
[im 1/20]
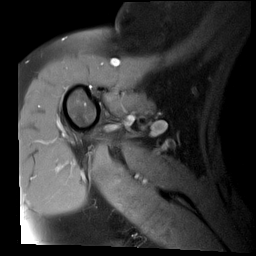
[im 3/20]
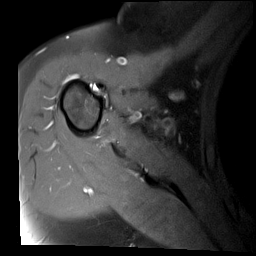
[im 6/20]
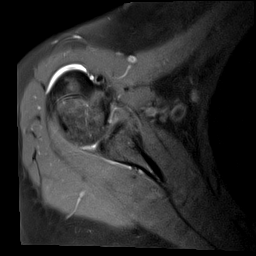
[im 9/20]
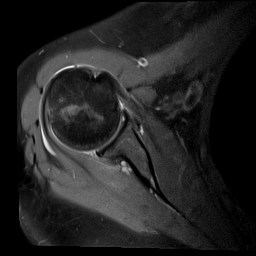
[im 11/20]
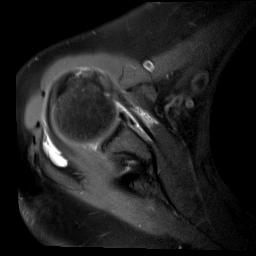
[im 14/20]
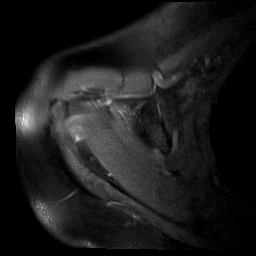
[im 17/20]
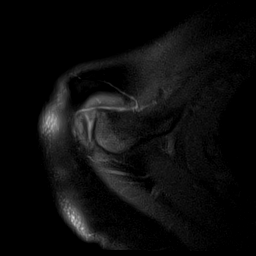
[im 20/20]
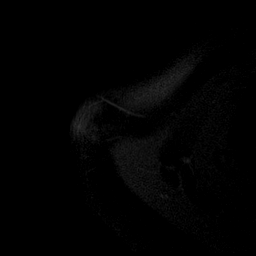

[Series 4: T2 fat-sat · oblique · 4.0mm · 0.55mm/px · 9 of 22 slices shown (1 of 2)]
[im 1/22]
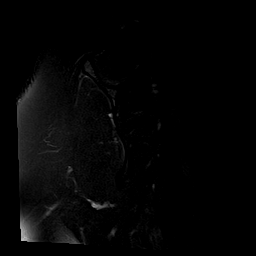
[im 3/22]
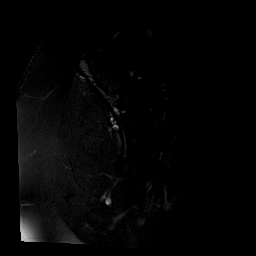
[im 6/22]
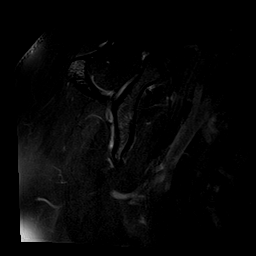
[im 8/22]
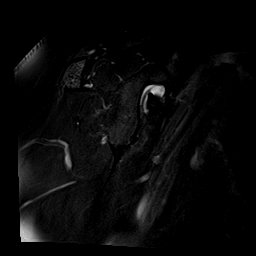
[im 11/22]
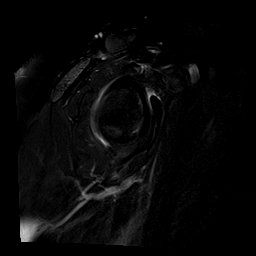
[im 14/22]
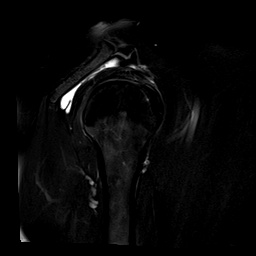
[im 16/22]
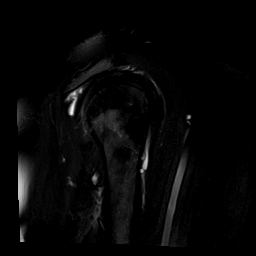
[im 19/22]
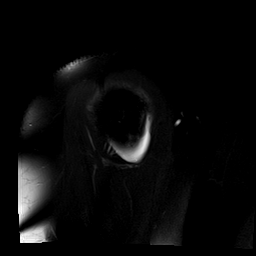
[im 22/22]
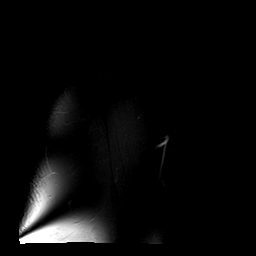

[Series 5: T1 · oblique · 4.0mm · 0.27mm/px · 5 of 22 slices shown]
[im 1/22]
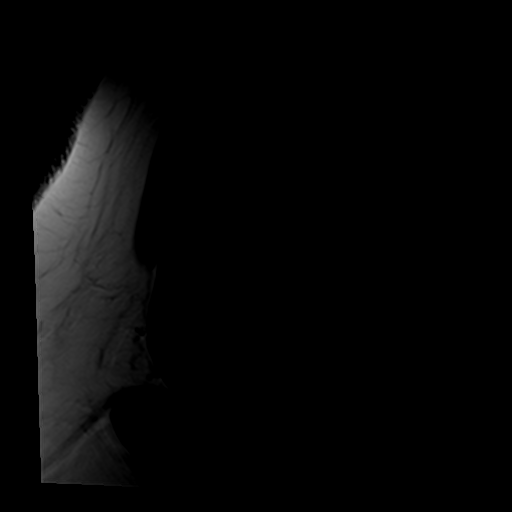
[im 3/22]
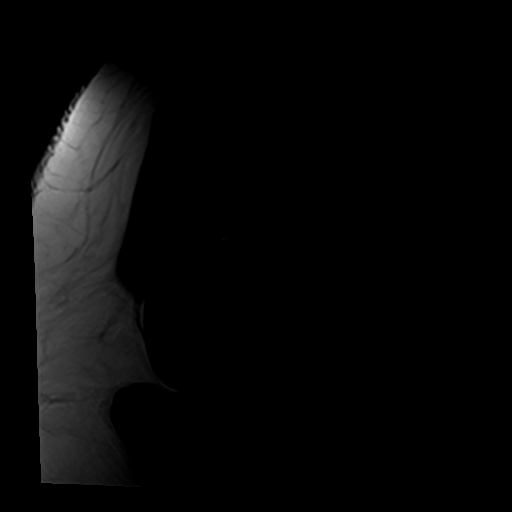
[im 6/22]
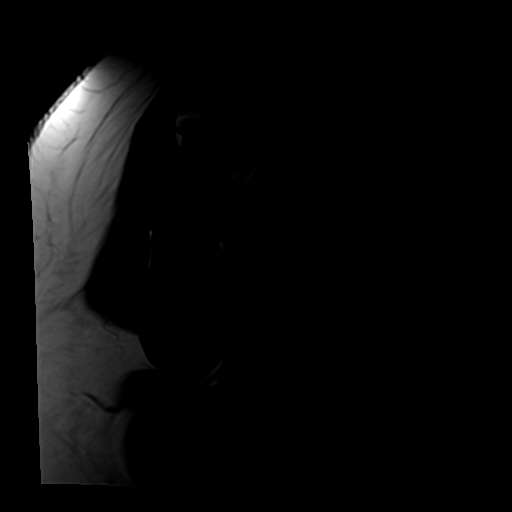
[im 8/22]
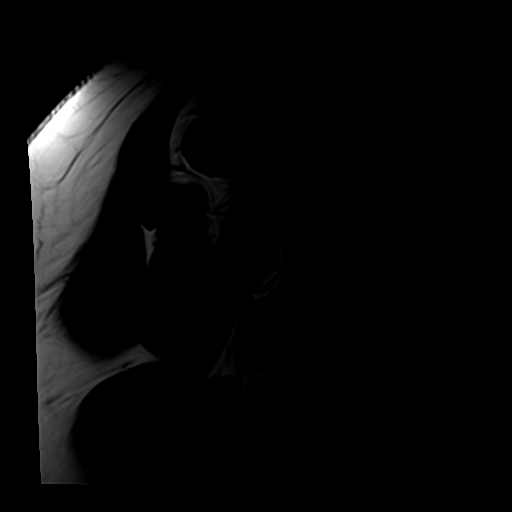
[im 14/22]
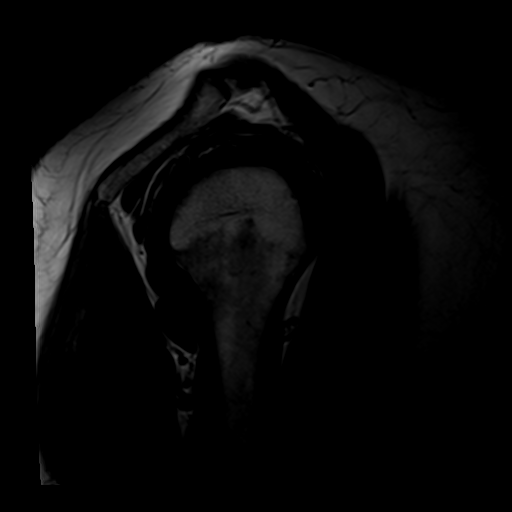

[Series 6: T2 fat-sat · oblique · 4.0mm · 0.55mm/px · 7 of 18 slices shown (2 of 2)]
[im 1/18]
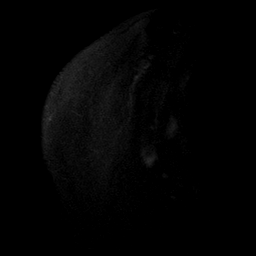
[im 3/18]
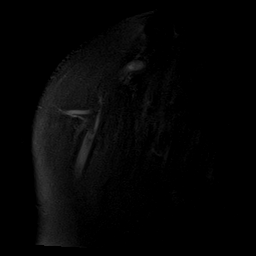
[im 6/18]
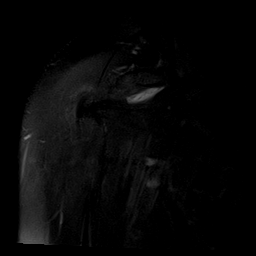
[im 9/18]
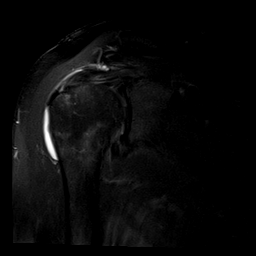
[im 12/18]
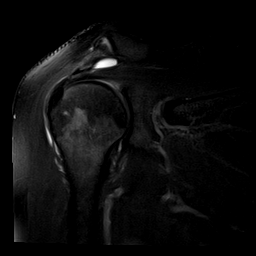
[im 15/18]
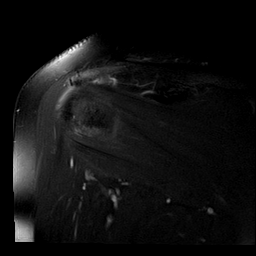
[im 18/18]
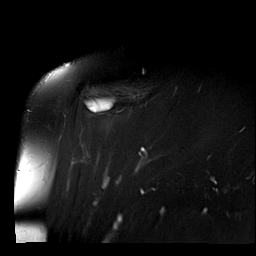

[Series 7: PD fat-sat · oblique · 4.0mm · 0.27mm/px · 7 of 18 slices shown (2 of 2)]
[im 1/18]
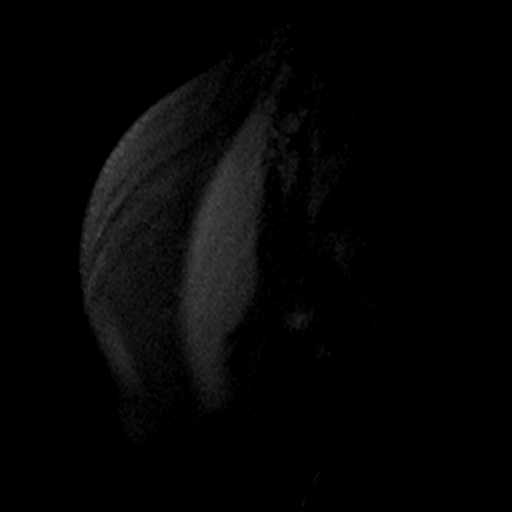
[im 3/18]
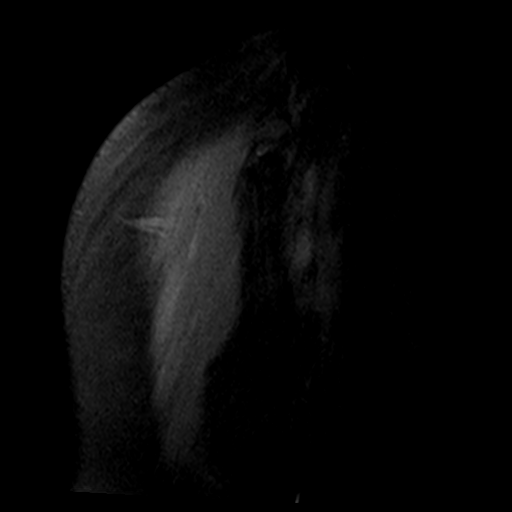
[im 6/18]
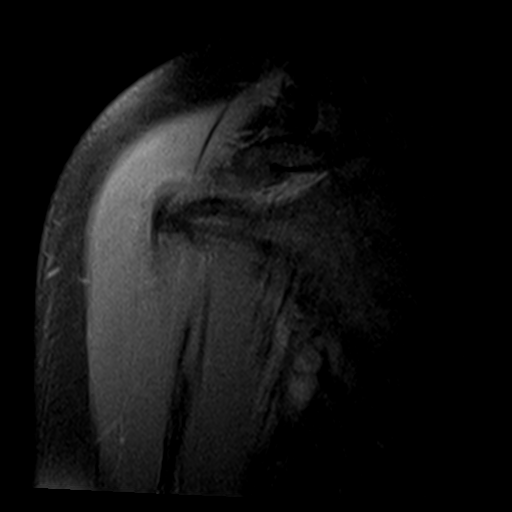
[im 9/18]
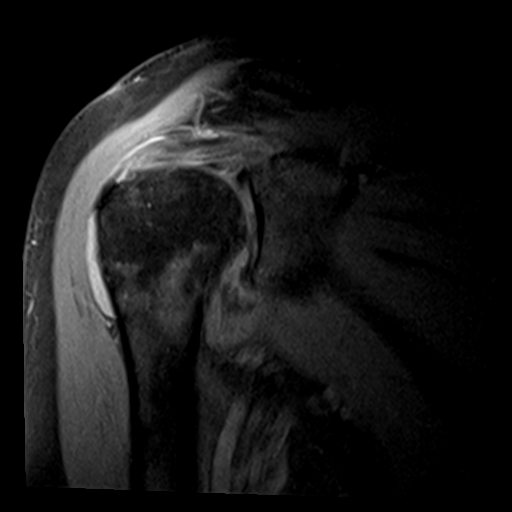
[im 12/18]
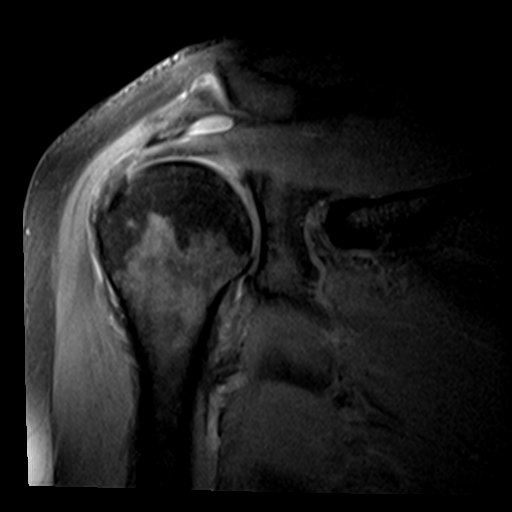
[im 15/18]
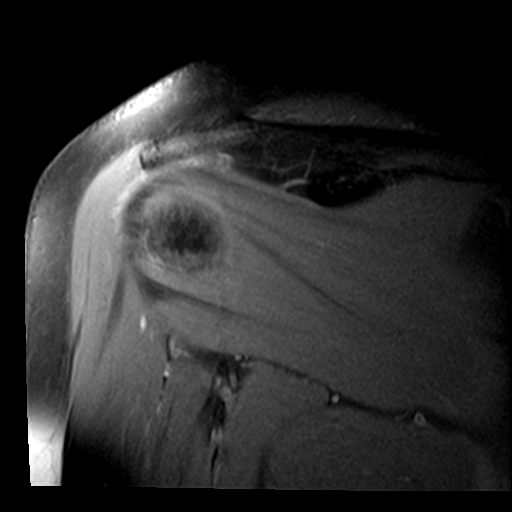
[im 18/18]
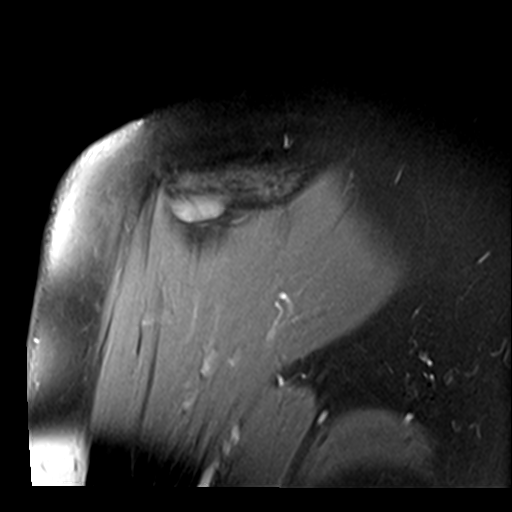

[36 of 40 positions shown; findings below may reference images not displayed]

FINDINGS: Rotator cuff: Small focal high-grade partial-thickness bursal
surface tear of the supraspinatus tendon anteriorly at the
insertion. The infraspinatus, teres minor, and subscapularis tendons
are unremarkable.

Muscles: Small focal area of fatty atrophy in the subscapularis
muscle. No muscle edema.

Biceps long head:  Intact and normally positioned.

Acromioclavicular Joint: Normal acromioclavicular joint. Type I,
lateral downsloping acromion. Moderate amount of fluid in the
subacromial/subdeltoid bursa.

Glenohumeral Joint: No joint effusion. No chondral defect.

Labrum: Grossly intact, but evaluation is limited by lack of
intraarticular fluid.

Bones:  No marrow abnormality, fracture or dislocation.

Other: None.
IMPRESSION: 1. Small focal high-grade partial-thickness bursal surface tear of
the anterior supraspinatus tendon at the insertion.
2. Moderate subacromial/subdeltoid bursitis.
3. Lateral downsloping acromion likely predisposing to subacromial
impingement.

## 2021-01-12 ENCOUNTER — Ambulatory Visit: Payer: Federal, State, Local not specified - PPO | Admitting: Allergy and Immunology

## 2021-01-12 ENCOUNTER — Other Ambulatory Visit: Payer: Self-pay

## 2021-01-12 ENCOUNTER — Encounter: Payer: Self-pay | Admitting: Allergy and Immunology

## 2021-01-12 VITALS — BP 126/82 | HR 71 | Temp 98.0°F | Resp 18 | Ht 67.0 in | Wt 195.5 lb

## 2021-01-12 DIAGNOSIS — K219 Gastro-esophageal reflux disease without esophagitis: Secondary | ICD-10-CM

## 2021-01-12 DIAGNOSIS — J3089 Other allergic rhinitis: Secondary | ICD-10-CM

## 2021-01-12 DIAGNOSIS — J4520 Mild intermittent asthma, uncomplicated: Secondary | ICD-10-CM | POA: Diagnosis not present

## 2021-01-12 MED ORDER — ALBUTEROL SULFATE HFA 108 (90 BASE) MCG/ACT IN AERS: 1.0000 | INHALATION_SPRAY | RESPIRATORY_TRACT | 1 refills | Status: DC | PRN

## 2021-01-12 MED ORDER — MONTELUKAST SODIUM 10 MG PO TABS
10.0000 mg | ORAL_TABLET | Freq: Every evening | ORAL | 2 refills | Status: DC
Start: 2021-01-12 — End: 2021-08-03

## 2021-01-12 MED ORDER — OMEPRAZOLE MAGNESIUM 20 MG PO TBEC: 40.0000 mg | DELAYED_RELEASE_TABLET | Freq: Every morning | ORAL | 5 refills | Status: DC

## 2021-01-12 MED ORDER — FAMOTIDINE 20 MG PO TABS: ORAL_TABLET | ORAL | 5 refills | Status: DC

## 2021-01-12 NOTE — Progress Notes (Signed)
[place] [facility] [place] [facility] [place] [facility] [place] [facility] [place]   Follow-up Note  Referring Provider: Lorenda Ishihara,* Primary Provider: Lorenda Ishihara, MD Date of Office Visit: 01/12/2021  Subjective:   Wendy Shelton (DOB: 1970/10/31) is a 50 y.o. female who [facility] 01[facility]21 in re-evaluation of the following:  HPI: Wendy Shelton returns to this clinic in evaluation of asthma and allergic rhinitis and LPR.  Her last visit to this clinic was 22 Sep 2020.  She has done really well with her airway and rarely uses a short acting bronchodilator and has had very little problems with her nose while using a combination of montelukast and Flonase.  She has not required a systemic steroid or an antibiotic for any type of airway issue.  However, at the tail end of July [place] with a respiratory tract infection and she still feels as though she has some postnasal drip and some fullness in her left ear without any anosmia or ugly nasal discharge or hearing loss or tinnitus or vertigo and she really does not have any cough.  She did performed a home COVID swab which was negative.  She is much better regarding that issue but still has some lingering mucus production.  Her reflux is going quite well.  She was able to discontinue her famotidine during her last visit and she was down to tapering her proton pump inhibitor every other day while she remained away from caffeine and chocolate consumption.  However, she started Ozempic and ever since then she has had a little bit more problems with abdominal fullness and nausea and she found that she needs to increase her omeprazole to once a day and take some additional Pepcid and that is working quite well in controlling these Ozempic side effects.  Allergies as of 01/12/2021       Reactions   Penicillins Hives   Amoxicillin    rash   Keflex [cephalexin]    rash   Sulfa  Antibiotics    Rash        Medication List    albuterol 108 (90 Base) MCG/ACT inhaler Commonly known as: VENTOLIN HFA Inhale 1-2 puffs into the lungs every 4 (four) hours as needed for wheezing or shortness of breath.   ALPRAZolam 0.5 MG tablet Commonly known as: XANAX Take 0.5 mg by mouth as needed.   dexmethylphenidate 20 MG 24 hr capsule Commonly known as: FOCALIN XR Take 15 mg by mouth daily.   guanFACINE 1 MG Tb24 ER tablet Commonly known as: INTUNIV TK 1 T PO BID   loratadine 10 MG tablet Commonly known as: CLARITIN 1 tablet   montelukast 10 MG tablet Commonly known as: SINGULAIR TAKE 1 TABLET(10 MG) BY MOUTH AT BEDTIME   NP Thyroid 60 MG tablet Generic drug: thyroid Take 60 mg by mouth daily.   omeprazole 20 MG tablet Commonly known as: PriLOSEC OTC Take 1 tablet (20 mg total) by mouth daily.   Ozempic (0.25 or 0.5 MG/DOSE) 2 MG/1.5ML Sopn Generic drug: Semaglutide(0.25 or 0.5MG /DOS) Inject into the skin.   progesterone 100 MG capsule Commonly known as: PROMETRIUM Take 100 mg by mouth at bedtime.   Qelbree 200 MG 24 hr capsule Generic drug: viloxazine ER Take 200 mg by mouth daily.   valACYclovir 1000 MG tablet Commonly known as: VALTREX TAKE 2 TABLETS BY MOUTH 2 TIMES DAILY FOR 3-7 DAYS AS NEEDED    Past Medical History:  Diagnosis  Date   ADHD    Anxiety    Asthma     Past Surgical History:  Procedure Laterality Date   FOOT SURGERY     hammer toe and bunion    Review of systems negative except as noted in HPI / PMHx or noted below:  Review of Systems  Constitutional: Negative.   HENT: Negative.    Eyes: Negative.   Respiratory: Negative.    Cardiovascular: Negative.   Gastrointestinal: Negative.   Genitourinary: Negative.   Musculoskeletal: Negative.   Skin: Negative.   Neurological: Negative.   Endo/Heme/Allergies: Negative.   Psychiatric/Behavioral: Negative.      Objective:   Vitals:   01/12/21 1541  BP: 126/82   Pulse: 71  Resp: 18  Temp: 98 F (36.7 C)  SpO2: 97%   Height: 5\' 7"  (170.2 cm)  Weight: 195 lb 8 oz (88.7 kg)   Physical Exam Constitutional:      Appearance: She is not diaphoretic.  HENT:     Head: Normocephalic.     Right Ear: Tympanic membrane, ear canal and external ear normal.     Left Ear: Tympanic membrane, ear canal and external ear normal.     Nose: Nose normal. No mucosal edema or rhinorrhea.     Mouth/Throat:     Pharynx: Uvula midline. No oropharyngeal exudate.  Eyes:     Conjunctiva/sclera: Conjunctivae normal.  Neck:     Thyroid: No thyromegaly.     Trachea: Trachea normal. No tracheal tenderness or tracheal deviation.  Cardiovascular:     Rate and Rhythm: Normal rate and regular rhythm.     Heart sounds: Normal heart sounds, S1 normal and S2 normal. No murmur heard. Pulmonary:     Effort: No respiratory distress.     Breath sounds: Normal breath sounds. No stridor. No wheezing or rales.  Lymphadenopathy:     Head:     Right side of head: No tonsillar adenopathy.     Left side of head: No tonsillar adenopathy.     Cervical: No cervical adenopathy.  Skin:    Findings: No erythema or rash.     Nails: There is no clubbing.  Neurological:     Mental Status: She is alert.    Diagnostics:    Spirometry was performed and demonstrated an FEV1 of 3.0 at 95 % of predicted.   Assessment and Plan:   1. Asthma, mild intermittent, well-controlled   2. Perennial and seasonal allergic rhinitis   3. LPRD (laryngopharyngeal reflux disease)    1.  Treat and prevent inflammation:   A. Flonase - 1-2 sprays each nostril 1 time per day  B. Can attempt to discontinue montelukast  2.  Continue to treat and prevent reflux/LPR:   A. Consolidate all forms of caffeine including chocolate  B. Omeprazole 40 mg - 1 tablet 1 time per day in AM  C. Can use famotidine 20-40 mg 1 time per day in p.m. if needed  D. Replace throat clearing with swallowing maneuver  3.  If  needed:   A. OTC antihistamine  B. Albuterol HFA - 2 inhalations every 4-6 hours  4.  Return to clinic in 6 months or earlier if problem  5. Obtain fall flu vaccine  will continue to use anti-inflammatory medications for her airway.  She is done relatively well and she can attempt to discontinue her montelukast.  She had a recent viral respiratory tract infection and she is appears to be clearing that issue at  this point.  She has had some more problems with reflux ever since she started Ozempic and she is certainly welcome to alter her dose of omeprazole and famotidine to a dose that results in control of this issue.  Assuming she does well with this plan I will see her back in this clinic in 6 months or earlier if there is a problem.  Laurette Schimke, MD Allergy / Immunology [facility]

## 2021-01-12 NOTE — Patient Instructions (Signed)
  1.  Treat and prevent inflammation:   A. Flonase - 1-2 sprays each nostril 1 time per day  B. Can attempt to discontinue montelukast  2.  Continue to treat and prevent reflux/LPR:   A. Consolidate all forms of caffeine including chocolate  B. Omeprazole 40 mg - 1 tablet 1 time per day in AM  C. Can use famotidine 20-40 mg 1 time per day in p.m. if needed  D. Replace throat clearing with swallowing maneuver  3.  If needed:   A. OTC antihistamine  B. Albuterol HFA - 2 inhalations every 4-6 hours  4.  Return to clinic in 6 months or earlier if problem  5. Obtain fall flu vaccine

## 2021-01-13 ENCOUNTER — Encounter: Payer: Self-pay | Admitting: Allergy and Immunology

## 2021-05-11 ENCOUNTER — Ambulatory Visit: Payer: Federal, State, Local not specified - PPO | Admitting: Sports Medicine

## 2021-05-11 VITALS — BP 124/80 | Ht 67.0 in | Wt 184.0 lb

## 2021-05-11 DIAGNOSIS — M2141 Flat foot [pes planus] (acquired), right foot: Secondary | ICD-10-CM | POA: Diagnosis not present

## 2021-05-11 DIAGNOSIS — M21069 Valgus deformity, not elsewhere classified, unspecified knee: Secondary | ICD-10-CM

## 2021-05-11 DIAGNOSIS — G8929 Other chronic pain: Secondary | ICD-10-CM

## 2021-05-11 DIAGNOSIS — M25551 Pain in right hip: Secondary | ICD-10-CM | POA: Diagnosis not present

## 2021-05-11 DIAGNOSIS — M25562 Pain in left knee: Secondary | ICD-10-CM

## 2021-05-11 DIAGNOSIS — M2142 Flat foot [pes planus] (acquired), left foot: Secondary | ICD-10-CM

## 2021-05-11 NOTE — Progress Notes (Signed)
PCP: Lorenda Ishihara, MD  Subjective:   HPI: Patient is a 50 y.o. female here for evaluation of right hip and left knee pain.  Right hip: This has bothered her on and off for at least 6 months.  Her pain is localized on the lateral hip sometimes will go into the buttock as well.  Sometimes her pain will radiate down the lateral side of the thigh, although no radicular symptoms or numbness tingling down the leg into the feet.  Her pain has progressed over the last few weeks is now painful to sleep on that side at nighttime.  Her pain is worse with walking up and down steps, at times she may feel a clicking sensation of the lateral hip.  She has tried ice as well as over-the-counter Tylenol and ibuprofen at times which does help her pain.  She denies any pain radiating into the groin.  Left knee: She has had a history of left knee pain for few years, although has worsened over the last year.  [facility] and had x-rays done of the knees which showed great joint spaces without significant arthritis.  There is patella alta left knee greater than right knee.  She has more significant pain with squatting and going down steps.  At times she feel like the knee may give out, although it does not.  She denies any swelling, redness or erythema about the knee.  He has been told that she is "knock-kneed" in the past.  Reports a history of being hypermobile.  No specific diagnosis, though she has worked with a physical therapist in the past that states she is quite hypermobile of her joints. Does report that her mother had bilateral knee replacements in her late 40s/early 24s.  Past Medical History:  Diagnosis Date   ADHD    Anxiety    Asthma     Current Outpatient Medications on File Prior to Visit  Medication Sig Dispense Refill   albuterol (VENTOLIN HFA) 108 (90 Base) MCG/ACT inhaler Inhale 1-2 puffs into the lungs every 4 (four) hours as needed for wheezing or shortness  of breath. 18 g 1   ALPRAZolam (XANAX) 0.5 MG tablet Take 0.5 mg by mouth as needed.      dexmethylphenidate (FOCALIN XR) 20 MG 24 hr capsule Take 15 mg by mouth daily.   0   famotidine (PEPCID) 20 MG tablet Take one to two tablets nightly 60 tablet 5   guanFACINE (INTUNIV) 1 MG TB24 ER tablet TK 1 T PO BID  2   loratadine (CLARITIN) 10 MG tablet 1 tablet     montelukast (SINGULAIR) 10 MG tablet Take 1 tablet (10 mg total) by mouth at bedtime. TAKE 1 TABLET(10 MG) BY MOUTH AT BEDTIME 30 tablet 2   NP THYROID 60 MG tablet Take 60 mg by mouth daily.     omeprazole (PRILOSEC OTC) 20 MG tablet Take 2 tablets (40 mg total) by mouth every morning. Takes one to two tablets 30 tablet 5   OZEMPIC, 0.25 OR 0.5 MG/DOSE, 2 MG/1.5ML SOPN Inject into the skin.     progesterone (PROMETRIUM) 100 MG capsule Take 100 mg by mouth at bedtime.     valACYclovir (VALTREX) 1000 MG tablet TAKE 2 TABLETS BY MOUTH 2 TIMES DAILY FOR 3-7 DAYS AS NEEDED  0   viloxazine ER (QELBREE) 200 MG 24 hr capsule Take 200 mg by mouth daily.     No current facility-administered medications on file  prior to visit.    Past Surgical History:  Procedure Laterality Date   FOOT SURGERY     hammer toe and bunion    Allergies  Allergen Reactions   Penicillins Hives   Amoxicillin     rash   Keflex [Cephalexin]     rash   Sulfa Antibiotics     Rash     BP 124/80    Ht 5\' 7"  (1.702 m)    Wt 184 lb (83.5 kg)    BMI 28.82 kg/m   [facility] 05/11/2021  Frequency of aerobic exercise (# of days/week) 4  Average time in minutes 30  Frequency of strengthening activities (# of days/week) 3    No flowsheet data found.      Objective:  Physical Exam:  Gen: Well-appearing, in no acute distress; non-toxic CV: Regular Rate. Well-perfused. Warm.  Resp: Breathing unlabored on room air; no wheezing. Psych: Fluid speech in conversation; appropriate affect; normal thought process Neuro: Sensation intact  throughout. No gross coordination deficits.  MSK:   - Right hip: + TTP over the right greater trochanter, no swelling, erythema noted.  There is full range of motion about the hip.  There is significant weakness with resisted hip abduction, otherwise full strength in other directions.  Negative FABER, FADIR testing.  Neurovascular intact distally.  -Left knee: + Mild TTP in the superior aspect of the patella, no joint line TTP.  Inspection yields no erythema, swelling or ecchymosis. + Apprehension with patellar grind test.  Full range of motion about the knee 0-140 degrees.  There is no varus or valgus instability.  There is a reproducible click with Nobles compression test, although without significant pain.  Negative anterior/posterior drawer, Lachman's, McMurray's testing.  - Gait analysis: + Significant genu valgum upon standing.  Flexible pes planus bilaterally. + Trendelenburg with the left hip dropping through swing phase.  There is significant crossover of both knees past the midline through swing phase.    Assessment & Plan:  1.  Right greater trochanteric pain syndrome -significant weakness with hip abduction. 2.  Left knee pain - most indicative of patellofemoral pain syndrome.  With associated patella alta. 3.  Flexible pes planus, bilaterally 4.  Genu valgum bilaterally  -HEP for lateral hip abduction strengthening of bilateral hips -HEP: Quadricep, VMO and anterior hip exercises for the left knee -Discussed safe exercises for the patient (avoid deep flexion), and discussed importance of getting properly fitted for the bike to not put extra stress on the knees -May take Tylenol or ibuprofen for breakthrough pain control -Patient does have custom orthotics that she wears sometimes previously made by Dr. 05/13/2021.  If this continues to be an issue, would recommend bringing those in to evaluate and potentially adding some medial arch support in the future -Follow-up in 6 weeks  Al Corpus, DO PGY-4, Sports Medicine Fellow [facility]  Patient seen and evaluated with the sports medicine fellow.  I agree with the above plan of care.  Treatment as above and then follow-up with me again in 6 weeks.  If symptoms persist at follow-up then consider formal physical therapy.  I have also asked her to bring in her custom orthotics to her follow-up visit as well.

## 2021-05-11 NOTE — Patient Instructions (Signed)
It was great to meet you today, thank you for letting me participate in your care.  Today, we discussed your right hip and left knee. The right hip demonstrates significant weakness in the hip abductor muscles, which in turn is causing a bursitis like pain on the lateral hip. The left knee is more of a patellofemoral issue around the kneecap.  Things to do to help her pain: -Be consistent with your hip exercises of both hips -Continue home exercises we will show you for the left knee to strengthen the quadriceps around the knee -You may continue exercising and biking, would recommend you get fit in a bike shop to ensure that your seat is the appropriate height as this can certainly lead knee pain -You may take over-the-counter anti-inflammatories as needed for the pain  You will follow-up in about 6 weeks with Dr. Margaretha Sheffield  If you have any further questions, please give the clinic a call [phone].  Cheers,  Madelyn Brunner, DO [facility]

## 2021-06-22 ENCOUNTER — Ambulatory Visit: Payer: Federal, State, Local not specified - PPO | Admitting: Sports Medicine

## 2021-06-24 ENCOUNTER — Ambulatory Visit: Payer: Federal, State, Local not specified - PPO | Admitting: Sports Medicine

## 2021-06-24 VITALS — BP 128/88 | Ht 67.0 in | Wt 184.0 lb

## 2021-06-24 DIAGNOSIS — G8929 Other chronic pain: Secondary | ICD-10-CM

## 2021-06-24 DIAGNOSIS — M25562 Pain in left knee: Secondary | ICD-10-CM | POA: Diagnosis not present

## 2021-06-24 DIAGNOSIS — M25551 Pain in right hip: Secondary | ICD-10-CM

## 2021-06-24 NOTE — Progress Notes (Signed)
° °  Subjective:    Patient ID: Wendy Shelton, female    DOB: 12/04/1970, 51 y.o.   MRN: [id number]  HPI  Burman Nieves presents today for follow-up on right hip and left knee pain.  She has noticed some improvement with her home exercises although she admits that she has not been quite as diligent about doing them as she should.  She did have some increasing lateral hip pain after a long hike a few days ago.  She also brought in her custom orthotics for [place] to evaluate today.    Review of Systems As above    Objective:   Physical Exam  Well-developed, well-nourished.  No acute distress  Patient continues to demonstrate weakness with resisted hip abduction bilaterally, right greater than left.  Negative logroll bilaterally.  Left knee shows full range of motion with no effusion.  Positive J sign consistent with VMO weakness.  Evaluation of her gait with her orthotics in place show a neutral foot strike.  No limp.      Assessment & Plan:   Right hip pain secondary to greater trochanteric pain syndrome Left knee pain secondary to patellofemoral pain  Patient has noticed some improvement with her home exercises but she is willing to meet with a physical therapist.  She has had good results with physical therapy at [facility].  I will refer her to that office and she can wean to a home exercise program per their discretion.  She understands that both of these issues will take time to improve as she continues to gain strength in both areas.  Follow-up with me as needed.  This note was dictated using Dragon naturally speaking software and may contain errors in syntax, spelling, or content which have not been identified prior to signing this note.

## 2021-07-27 ENCOUNTER — Ambulatory Visit: Payer: Federal, State, Local not specified - PPO | Admitting: Allergy and Immunology

## 2021-08-03 ENCOUNTER — Other Ambulatory Visit: Payer: Self-pay

## 2021-08-03 ENCOUNTER — Encounter: Payer: Self-pay | Admitting: Allergy and Immunology

## 2021-08-03 ENCOUNTER — Ambulatory Visit: Payer: Federal, State, Local not specified - PPO | Admitting: Allergy and Immunology

## 2021-08-03 VITALS — BP 116/78 | HR 70 | Temp 97.1°F | Resp 70 | Ht 67.0 in | Wt 188.6 lb

## 2021-08-03 DIAGNOSIS — K219 Gastro-esophageal reflux disease without esophagitis: Secondary | ICD-10-CM

## 2021-08-03 DIAGNOSIS — J4520 Mild intermittent asthma, uncomplicated: Secondary | ICD-10-CM

## 2021-08-03 DIAGNOSIS — J3089 Other allergic rhinitis: Secondary | ICD-10-CM

## 2021-08-03 MED ORDER — LORATADINE 10 MG PO TABS: 10.0000 mg | ORAL_TABLET | Freq: Two times a day (BID) | ORAL | 5 refills | Status: DC | PRN

## 2021-08-03 MED ORDER — OMEPRAZOLE MAGNESIUM 20 MG PO TBEC: 40.0000 mg | DELAYED_RELEASE_TABLET | Freq: Every morning | ORAL | 5 refills | Status: DC

## 2021-08-03 MED ORDER — ALBUTEROL SULFATE HFA 108 (90 BASE) MCG/ACT IN AERS: 1.0000 | INHALATION_SPRAY | RESPIRATORY_TRACT | 1 refills | Status: DC | PRN

## 2021-08-03 MED ORDER — FAMOTIDINE 20 MG PO TABS: 40.0000 mg | ORAL_TABLET | Freq: Every evening | ORAL | 5 refills | Status: DC

## 2021-08-03 MED ORDER — MONTELUKAST SODIUM 10 MG PO TABS: 10.0000 mg | ORAL_TABLET | Freq: Every evening | ORAL | 2 refills | Status: DC

## 2021-08-03 NOTE — Progress Notes (Signed)
? ?[place] - [employer] - [place] - [place] - [place] ? ? ?Follow-up Note ? ?Referring Provider: Lorenda Ishihara,* ?Primary Provider: Lorenda Ishihara, MD ?Date of Office Visit: 08/03/2021 ? ?Subjective:  ? ?Wendy Shelton (DOB: 07-15-1970) is a 51 y.o. female who [facility] in re-evaluation of the following: ? ?HPI: Wendy Shelton returns to this clinic in evaluation of asthma, allergic rhinitis, and LPR.  Her last visit to this clinic was 12 January 2021. ? ?She has noticed a change regarding her reflux and throat clearing recently and this appears to correlate with the increased consumption of chocolate and caffeine.  She will use either omeprazole or pantoprazole most days of the week although she does develop some problems with constipation when using pantoprazole.  Sometimes she will add in famotidine. ? ?Her nose has been doing relatively well while using some nasal steroid.  She had no issues with asthma and has not had to use a short acting bronchodilator.  She has not required a systemic steroid or an antibiotic for any type of airway issue. ? ?She has received this year's flu vaccine and has received 2 COVID vaccines. ?  ? ?Allergies as of 08/03/2021   ? ?   Reactions  ? Penicillins Hives  ? Amoxicillin   ? rash  ? Keflex [cephalexin]   ? rash  ? Sulfa Antibiotics   ? Rash  ? ?  ? ?  ?Medication List  ? ? ?albuterol 108 (90 Base) MCG/ACT inhaler ?Commonly known as: VENTOLIN HFA ?Inhale 1-2 puffs into the lungs every 4 (four) hours as needed for wheezing or shortness of breath. ?  ?ALPRAZolam 0.5 MG tablet ?Commonly known as: Prudy Feeler ?Take 0.5 mg by mouth as needed. ?  ?buPROPion 300 MG 24 hr tablet ?Commonly known as: WELLBUTRIN XL ?Take 1 tablet by mouth every morning. ?  ?dexmethylphenidate 20 MG 24 hr capsule ?Commonly known as: FOCALIN XR ?Take 15 mg by mouth daily. ?  ?famotidine 20 MG tablet ?Commonly known as: PEPCID ?Take one to two tablets  nightly ?  ?fluorouracil 5 % cream ?Commonly known as: EFUDEX ?Apply 1 application. topically 2 (two) times daily. ?  ?loratadine 10 MG tablet ?Commonly known as: CLARITIN ?1 tablet ?  ?montelukast 10 MG tablet ?Commonly known as: SINGULAIR ?Take 1 tablet (10 mg total) by mouth at bedtime. TAKE 1 TABLET(10 MG) BY MOUTH AT BEDTIME ?  ?NP Thyroid 60 MG tablet ?Generic drug: thyroid ?Take 60 mg by mouth daily. ?  ?omeprazole 20 MG tablet ?Commonly known as: PriLOSEC OTC ?Take 2 tablets (40 mg total) by mouth every morning. Takes one to two tablets ?  ?Ozempic (0.25 or 0.5 MG/DOSE) 2 MG/1.5ML Sopn ?Generic drug: Semaglutide(0.25 or 0.5MG /DOS) ?Inject into the skin. ?  ?progesterone 100 MG capsule ?Commonly known as: PROMETRIUM ?Take 100 mg by mouth at bedtime. ?  ?valACYclovir 1000 MG tablet ?Commonly known as: VALTREX ?TAKE 2 TABLETS BY MOUTH 2 TIMES DAILY FOR 3-7 DAYS AS NEEDED ?  ?Vitamin B-12 5000 MCG Tbdp ?Take 1 tablet by mouth daily at 12 noon. ?  ? ?Past Medical History:  ?Diagnosis Date  ? ADHD   ? Anxiety   ? Asthma   ? ? ?Past Surgical History:  ?Procedure Laterality Date  ? FOOT SURGERY    ? hammer toe and bunion  ? ? ?Review of systems negative except as noted in HPI / PMHx or noted below: ? ?Review of Systems  ?Constitutional: Negative.   ?  HENT: Negative.    ?Eyes: Negative.   ?Respiratory: Negative.    ?Cardiovascular: Negative.   ?Gastrointestinal: Negative.   ?Genitourinary: Negative.   ?Musculoskeletal: Negative.   ?Skin: Negative.   ?Neurological: Negative.   ?Endo/Heme/Allergies: Negative.   ?Psychiatric/Behavioral: Negative.    ? ? ?Objective:  ? ?Vitals:  ? 08/03/21 1109  ?BP: 116/78  ?Pulse: 70  ?Resp: (!) 70  ?Temp: (!) 97.1 ?F (36.2 ?C)  ?SpO2: 100%  ? ?Height: 5\' 7"  (170.2 cm)  ?Weight: 188 lb 9.6 oz (85.5 kg)  ? ?Physical Exam ?Constitutional:   ?   Appearance: She is not diaphoretic.  ?HENT:  ?   Head: Normocephalic.  ?   Right Ear: Tympanic membrane, ear canal and external ear normal.  ?    Left Ear: Tympanic membrane, ear canal and external ear normal.  ?   Nose: Nose normal. No mucosal edema or rhinorrhea.  ?   Mouth/Throat:  ?   Pharynx: Uvula midline. No oropharyngeal exudate.  ?Eyes:  ?   Conjunctiva/sclera: Conjunctivae normal.  ?Neck:  ?   Thyroid: No thyromegaly.  ?   Trachea: Trachea normal. No tracheal tenderness or tracheal deviation.  ?Cardiovascular:  ?   Rate and Rhythm: Normal rate and regular rhythm.  ?   Heart sounds: Normal heart sounds, S1 normal and S2 normal. No murmur heard. ?Pulmonary:  ?   Effort: No respiratory distress.  ?   Breath sounds: Normal breath sounds. No stridor. No wheezing or rales.  ?Lymphadenopathy:  ?   Head:  ?   Right side of head: No tonsillar adenopathy.  ?   Left side of head: No tonsillar adenopathy.  ?   Cervical: No cervical adenopathy.  ?Skin: ?   Findings: No erythema or rash.  ?   Nails: There is no clubbing.  ?Neurological:  ?   Mental Status: She is alert.  ? ? ?Diagnostics:  ?  ?Spirometry was performed and demonstrated an FEV1 of 2.45 at 79 % of predicted. ? ?Assessment and Plan:  ? ?1. Asthma, mild intermittent, well-controlled   ?2. Perennial and seasonal allergic rhinitis   ?3. LPRD (laryngopharyngeal reflux disease)   ? ? ?1.  Treat and prevent inflammation: ? ? A. Flonase - 1-2 sprays each nostril 1 time per day ? ?2.  Continue to treat and prevent reflux/LPR: ? ? A. Consolidate caffeine including chocolate ? B. Omeprazole 40 mg - 1 tablet 1 time per day in AM ? C. Replace throat clearing with swallowing maneuver ? ?3.  If needed: ? ? A. OTC antihistamine ? B. Albuterol HFA - 2 inhalations every 4-6 hours ? C. Famotidine 20-40 mg 1 time per day in p.m.  ? ?4.  Return to clinic in 6 months or earlier if problem ? ?Overall has been doing relatively well on her plan.  She has a good understanding about her respiratory tract problems tied up with atopic disease and reflux.  She knows what to do to get these under better control.  She will  continue on the plan noted above and we will see her back in this clinic in 6 months or earlier if there is a problem. ? ?Wendy Grater, MD ?Allergy / Immunology ?[facility] ?

## 2021-08-03 NOTE — Patient Instructions (Addendum)
?  1.  Treat and prevent inflammation: ? ? A. Flonase - 1-2 sprays each nostril 1 time per day ? ?2.  Continue to treat and prevent reflux/LPR: ? ? A. Consolidate caffeine including chocolate ? B. Omeprazole 40 mg - 1 tablet 1 time per day in AM ? C. Replace throat clearing with swallowing maneuver ? ?3.  If needed: ? ? A. OTC antihistamine ? B. Albuterol HFA - 2 inhalations every 4-6 hours ? C. Famotidine 20-40 mg 1 time per day in p.m.  ? ?4.  Return to clinic in 6 months or earlier if problem ? ? ?

## 2021-08-04 ENCOUNTER — Encounter: Payer: Self-pay | Admitting: Allergy and Immunology

## 2021-09-07 ENCOUNTER — Ambulatory Visit: Payer: Federal, State, Local not specified - PPO | Admitting: Allergy and Immunology

## 2021-11-28 ENCOUNTER — Encounter: Payer: Self-pay | Admitting: Allergy and Immunology

## 2022-02-08 ENCOUNTER — Ambulatory Visit: Payer: Federal, State, Local not specified - PPO | Admitting: Allergy and Immunology

## 2022-02-22 ENCOUNTER — Ambulatory Visit: Payer: Federal, State, Local not specified - PPO | Admitting: Allergy and Immunology

## 2022-02-22 ENCOUNTER — Telehealth: Payer: Self-pay | Admitting: Allergy and Immunology

## 2022-02-22 ENCOUNTER — Encounter: Payer: Self-pay | Admitting: Allergy and Immunology

## 2022-02-22 VITALS — BP 116/72 | HR 71 | Temp 98.2°F | Resp 16 | Ht 67.0 in | Wt 189.4 lb

## 2022-02-22 DIAGNOSIS — J3089 Other allergic rhinitis: Secondary | ICD-10-CM | POA: Diagnosis not present

## 2022-02-22 DIAGNOSIS — J4520 Mild intermittent asthma, uncomplicated: Secondary | ICD-10-CM

## 2022-02-22 DIAGNOSIS — K219 Gastro-esophageal reflux disease without esophagitis: Secondary | ICD-10-CM

## 2022-02-22 MED ORDER — FLUTICASONE PROPIONATE 50 MCG/ACT NA SUSP: 2.0000 | Freq: Every day | NASAL | 11 refills | Status: DC

## 2022-02-22 MED ORDER — FAMOTIDINE 20 MG PO TABS: 40.0000 mg | ORAL_TABLET | Freq: Every evening | ORAL | 11 refills | Status: DC

## 2022-02-22 MED ORDER — OMEPRAZOLE MAGNESIUM 20 MG PO TBEC: 40.0000 mg | DELAYED_RELEASE_TABLET | Freq: Every morning | ORAL | 11 refills | Status: AC

## 2022-02-22 MED ORDER — ALBUTEROL SULFATE HFA 108 (90 BASE) MCG/ACT IN AERS: 1.0000 | INHALATION_SPRAY | RESPIRATORY_TRACT | 2 refills | Status: AC | PRN

## 2022-02-22 MED ORDER — LEVOCETIRIZINE DIHYDROCHLORIDE 5 MG PO TABS: 5.0000 mg | ORAL_TABLET | Freq: Every day | ORAL | 11 refills | Status: DC | PRN

## 2022-02-22 NOTE — Patient Instructions (Signed)
  1.  Treat and prevent inflammation:   A. Flonase - 1-2 sprays each nostril 1 time per day  2.  Continue to treat and prevent reflux/LPR:   A. Consolidate caffeine including chocolate  B. Omeprazole 40 mg - 1 tablet 1 time per day in AM  C. Replace throat clearing with swallowing maneuver  3.  If needed:   A. OTC antihistamine  B. Albuterol HFA - 2 inhalations every 4-6 hours  C. Famotidine 20-40 mg 1 time per day in p.m.   4.  Return to clinic in 12 months or earlier if problem  5. Obtain fall flu vaccine

## 2022-02-22 NOTE — Telephone Encounter (Signed)
Patient called back as she could not understanding the voice message. I informed her that all medication was cancelled except for the omeprazole. Patient verbalized understanding and was appreciative.

## 2022-02-22 NOTE — Telephone Encounter (Signed)
Lm for pt to call us back called pharmacy and had all meds canceled for now except omeprazole as far as refills

## 2022-02-22 NOTE — Telephone Encounter (Signed)
Wendy Shelton called in upset and states she only wanted refills of Omeprazole called in to the pharmacy but all of her other medications were called in.  Wendy Shelton wants to know if those can be cancelled.  Wendy Shelton states she doesn't use all of those medications anymore.  Wendy Shelton wants all of the medications from Dr. Neldon Mc discontinued except Omeprazole, Flonase, and Loratadine.  Please advise.

## 2022-02-22 NOTE — Progress Notes (Unsigned)
- [place] - [place]   Follow-up Note  Referring Provider: Leeroy Cha,* Primary Provider: Leeroy Cha, MD Date of Office Visit: 02/22/2022  Subjective:   Wendy Shelton (DOB: 1970-10-20) is a 51 y.o. female who returns to the [facility] on 02/22/2022 in re-evaluation of the following:  HPI: Wendy Shelton returns to this clinic in evaluation of asthma, allergic rhinitis, LPR.  Her last visit to this clinic was 03 August 2021.  She has had excellent control of her asthma and can exercise without any difficulty and rarely uses a short acting bronchodilator and has not required a systemic steroid to treat an exacerbation.  She has had very good control of her upper airway disease while intermittently using Flonase mostly just during the spring.  She has not required an antibiotic treating episode of sinusitis.  She intermittently treats her reflux with omeprazole.  Occasionally she will have postnasal drip.  If she has a flareup she will use omeprazole plus famotidine.  Allergies as of 02/22/2022       Reactions   Penicillins Hives   Sulfa Antibiotics Rash   Rash   Amoxicillin Rash   Cephalexin Rash   Penicillin G Nausea And Vomiting   Sulfur Nausea And Vomiting        Medication List    albuterol 108 (90 Base) MCG/ACT inhaler Commonly known as: VENTOLIN HFA Inhale 1-2 puffs into the lungs every 4 (four) hours as needed for wheezing or shortness of breath.   ALPRAZolam 0.5 MG tablet Commonly known as: XANAX Take 0.5 mg by mouth as needed.   buPROPion 300 MG 24 hr tablet Commonly known as: WELLBUTRIN XL Take 1 tablet by mouth every morning.   dexmethylphenidate 20 MG 24 hr capsule Commonly known as: FOCALIN XR Take 15 mg by mouth daily.   fluconazole 150 MG tablet Commonly known as: DIFLUCAN Take 150 mg by mouth daily.   hydrocortisone 2.5 % ointment apply topically one to two times daily  for 14   loratadine 10 MG tablet Commonly known as: CLARITIN Take 1 tablet (10 mg total) by mouth 2 (two) times daily as needed for allergies (Can take an extra dose during flare ups.).   NP Thyroid 60 MG tablet Generic drug: thyroid Take 60 mg by mouth daily.   omeprazole 20 MG tablet Commonly known as: PriLOSEC OTC Take 2 tablets (40 mg total) by mouth every morning. Takes one to two tablets   Ozempic (0.25 or 0.5 MG/DOSE) 2 MG/1.5ML Sopn Generic drug: Semaglutide(0.25 or 0.5MG /DOS) Inject into the skin.   progesterone 100 MG capsule Commonly known as: PROMETRIUM Take 100 mg by mouth at bedtime.   valACYclovir 1000 MG tablet Commonly known as: VALTREX TAKE 2 TABLETS BY MOUTH 2 TIMES DAILY FOR 3-7 DAYS AS NEEDED   Vitamin B-12 5000 MCG Tbdp Take 1 tablet by mouth daily at 12 noon.    Past Medical History:  Diagnosis Date   ADHD    Anxiety    Asthma     Past Surgical History:  Procedure Laterality Date   FOOT SURGERY     hammer toe and bunion    Review of systems negative except as noted in HPI / PMHx or noted below:  Review of Systems  Constitutional: Negative.   HENT: Negative.    Eyes: Negative.   Respiratory: Negative.    Cardiovascular: Negative.   Gastrointestinal: Negative.   Genitourinary: Negative.   Musculoskeletal: Negative.   Skin:  Negative.   Neurological: Negative.   Endo/Heme/Allergies: Negative.   Psychiatric/Behavioral: Negative.       Objective:   Vitals:   02/22/22 0853  BP: 116/72  Pulse: 71  Resp: 16  Temp: 98.2 F (36.8 C)  SpO2: 100%   Height: 5\' 7"  (170.2 cm)  Weight: 189 lb 6.4 oz (85.9 kg)   Physical Exam Constitutional:      Appearance: She is not diaphoretic.  HENT:     Head: Normocephalic.     Right Ear: Tympanic membrane, ear canal and external ear normal.     Left Ear: Tympanic membrane, ear canal and external ear normal.     Nose: Nose normal. No mucosal edema or rhinorrhea.     Mouth/Throat:      Pharynx: Uvula midline. No oropharyngeal exudate.  Eyes:     Conjunctiva/sclera: Conjunctivae normal.  Neck:     Thyroid: No thyromegaly.     Trachea: Trachea normal. No tracheal tenderness or tracheal deviation.  Cardiovascular:     Rate and Rhythm: Normal rate and regular rhythm.     Heart sounds: Normal heart sounds, S1 normal and S2 normal. No murmur heard. Pulmonary:     Effort: No respiratory distress.     Breath sounds: Normal breath sounds. No stridor. No wheezing or rales.  Lymphadenopathy:     Head:     Right side of head: No tonsillar adenopathy.     Left side of head: No tonsillar adenopathy.     Cervical: No cervical adenopathy.  Skin:    Findings: No erythema or rash.     Nails: There is no clubbing.  Neurological:     Mental Status: She is alert.     Diagnostics:    Spirometry was performed and demonstrated an FEV1 of 2.76 at 89 % of predicted.    Assessment and Plan:   1. Asthma, mild intermittent, well-controlled   2. Perennial and seasonal allergic rhinitis   3. LPRD (laryngopharyngeal reflux disease)    1.  Treat and prevent inflammation:   A. Flonase - 1-2 sprays each nostril 1 time per day  2.  Continue to treat and prevent reflux/LPR:   A. Consolidate caffeine including chocolate  B. Omeprazole 40 mg - 1 tablet 1 time per day in AM  C. Replace throat clearing with swallowing maneuver  3.  If needed:   A. OTC antihistamine  B. Albuterol HFA - 2 inhalations every 4-6 hours  C. Famotidine 20-40 mg 1 time per day in p.m.   4.  Return to clinic in 12 months or earlier if problem  5. Obtain fall flu vaccine  Wendy Shelton appears to be doing well regarding all of her respiratory tract issues and reflux issues at this current point in time on her current therapy.  She has a very good understanding of her disease state and how her medications work and we will see her back in this clinic in 1 year or earlier if there is a problem with this plan.   Allena Katz, MD Allergy / Immunology [place]

## 2022-02-23 ENCOUNTER — Encounter: Payer: Self-pay | Admitting: Allergy and Immunology

## 2022-07-04 ENCOUNTER — Other Ambulatory Visit (HOSPITAL_BASED_OUTPATIENT_CLINIC_OR_DEPARTMENT_OTHER): Payer: Self-pay

## 2022-07-08 ENCOUNTER — Other Ambulatory Visit (HOSPITAL_BASED_OUTPATIENT_CLINIC_OR_DEPARTMENT_OTHER): Payer: Self-pay

## 2022-07-08 MED ORDER — SEMAGLUTIDE-WEIGHT MANAGEMENT 0.25 MG/0.5ML ~~LOC~~ SOAJ
0.2500 mg | SUBCUTANEOUS | 2 refills | Status: AC
  Filled 2022-07-08: qty 2, 28d supply, fill #0

## 2022-07-18 ENCOUNTER — Other Ambulatory Visit (HOSPITAL_BASED_OUTPATIENT_CLINIC_OR_DEPARTMENT_OTHER): Payer: Self-pay

## 2022-07-20 ENCOUNTER — Other Ambulatory Visit (HOSPITAL_BASED_OUTPATIENT_CLINIC_OR_DEPARTMENT_OTHER): Payer: Self-pay

## 2022-07-21 ENCOUNTER — Other Ambulatory Visit: Payer: Self-pay | Admitting: Allergy and Immunology

## 2022-08-02 ENCOUNTER — Other Ambulatory Visit (HOSPITAL_BASED_OUTPATIENT_CLINIC_OR_DEPARTMENT_OTHER): Payer: Self-pay

## 2022-08-02 MED ORDER — WEGOVY 0.5 MG/0.5ML ~~LOC~~ SOAJ
0.5000 mg | SUBCUTANEOUS | 1 refills | Status: AC
  Filled 2022-08-02: qty 2, 28d supply, fill #0
  Filled 2022-09-02: qty 2, 28d supply, fill #1

## 2022-08-06 ENCOUNTER — Other Ambulatory Visit (HOSPITAL_BASED_OUTPATIENT_CLINIC_OR_DEPARTMENT_OTHER): Payer: Self-pay

## 2022-08-09 ENCOUNTER — Other Ambulatory Visit (HOSPITAL_COMMUNITY): Payer: Self-pay

## 2022-08-09 ENCOUNTER — Other Ambulatory Visit (HOSPITAL_BASED_OUTPATIENT_CLINIC_OR_DEPARTMENT_OTHER): Payer: Self-pay

## 2022-08-09 MED ORDER — DEXMETHYLPHENIDATE HCL ER 15 MG PO CP24
15.0000 mg | ORAL_CAPSULE | Freq: Every day | ORAL | 0 refills | Status: DC | PRN
  Filled 2022-08-09: qty 30, 30d supply, fill #0

## 2022-09-07 ENCOUNTER — Other Ambulatory Visit (HOSPITAL_BASED_OUTPATIENT_CLINIC_OR_DEPARTMENT_OTHER): Payer: Self-pay

## 2022-09-07 MED ORDER — DEXMETHYLPHENIDATE HCL ER 15 MG PO CP24
15.0000 mg | ORAL_CAPSULE | Freq: Every day | ORAL | 0 refills | Status: DC | PRN
  Filled 2022-09-07: qty 30, 30d supply, fill #0

## 2022-09-08 ENCOUNTER — Other Ambulatory Visit (HOSPITAL_BASED_OUTPATIENT_CLINIC_OR_DEPARTMENT_OTHER): Payer: Self-pay

## 2022-09-08 MED ORDER — BUPROPION HCL ER (XL) 150 MG PO TB24
150.0000 mg | ORAL_TABLET | Freq: Every day | ORAL | 5 refills | Status: DC
  Filled 2022-09-08: qty 30, 30d supply, fill #0
  Filled 2023-01-11: qty 30, 30d supply, fill #1

## 2022-09-12 ENCOUNTER — Other Ambulatory Visit (HOSPITAL_BASED_OUTPATIENT_CLINIC_OR_DEPARTMENT_OTHER): Payer: Self-pay

## 2022-09-12 MED ORDER — BUPROPION HCL ER (XL) 300 MG PO TB24
300.0000 mg | ORAL_TABLET | Freq: Every day | ORAL | 11 refills | Status: DC
  Filled 2022-09-12: qty 30, 30d supply, fill #0
  Filled 2022-10-23: qty 30, 30d supply, fill #1
  Filled 2022-11-22: qty 30, 30d supply, fill #2
  Filled 2022-12-20: qty 30, 30d supply, fill #3
  Filled 2023-02-04 – 2023-02-07 (×2): qty 30, 30d supply, fill #4
  Filled 2023-03-05: qty 30, 30d supply, fill #5
  Filled 2023-04-06: qty 30, 30d supply, fill #6
  Filled 2023-04-30: qty 30, 30d supply, fill #7
  Filled 2023-06-02 – 2023-06-03 (×2): qty 30, 30d supply, fill #8
  Filled 2023-08-02: qty 30, 30d supply, fill #10
  Filled 2023-08-30: qty 30, 30d supply, fill #11

## 2022-09-14 ENCOUNTER — Other Ambulatory Visit (HOSPITAL_BASED_OUTPATIENT_CLINIC_OR_DEPARTMENT_OTHER): Payer: Self-pay

## 2022-09-14 MED ORDER — SEMAGLUTIDE-WEIGHT MANAGEMENT 1 MG/0.5ML ~~LOC~~ SOAJ
1.0000 mg | SUBCUTANEOUS | 1 refills | Status: AC
  Filled 2022-09-14 – 2022-09-26 (×2): qty 2, 28d supply, fill #0

## 2022-09-26 ENCOUNTER — Other Ambulatory Visit (HOSPITAL_BASED_OUTPATIENT_CLINIC_OR_DEPARTMENT_OTHER): Payer: Self-pay

## 2022-10-10 ENCOUNTER — Other Ambulatory Visit (HOSPITAL_BASED_OUTPATIENT_CLINIC_OR_DEPARTMENT_OTHER): Payer: Self-pay

## 2022-10-10 MED ORDER — DEXMETHYLPHENIDATE HCL ER 15 MG PO CP24
ORAL_CAPSULE | ORAL | 0 refills | Status: DC
  Filled 2022-10-10: qty 20, 20d supply, fill #0
  Filled 2022-10-10: qty 10, 10d supply, fill #0

## 2022-10-21 ENCOUNTER — Other Ambulatory Visit (HOSPITAL_BASED_OUTPATIENT_CLINIC_OR_DEPARTMENT_OTHER): Payer: Self-pay

## 2022-10-21 MED ORDER — WEGOVY 1 MG/0.5ML ~~LOC~~ SOAJ
1.0000 mg | SUBCUTANEOUS | 2 refills | Status: AC
  Filled 2022-10-21: qty 2, 28d supply, fill #0

## 2022-11-16 ENCOUNTER — Other Ambulatory Visit (HOSPITAL_BASED_OUTPATIENT_CLINIC_OR_DEPARTMENT_OTHER): Payer: Self-pay

## 2022-11-16 MED ORDER — WEGOVY 1.7 MG/0.75ML ~~LOC~~ SOAJ
1.7000 mg | SUBCUTANEOUS | 2 refills | Status: AC
  Filled 2022-11-16: qty 3, 28d supply, fill #0

## 2022-11-22 ENCOUNTER — Other Ambulatory Visit (HOSPITAL_BASED_OUTPATIENT_CLINIC_OR_DEPARTMENT_OTHER): Payer: Self-pay

## 2022-11-22 MED ORDER — PROGESTERONE MICRONIZED 100 MG PO CAPS
200.0000 mg | ORAL_CAPSULE | Freq: Every day | ORAL | 1 refills | Status: DC
  Filled 2022-11-22: qty 180, 90d supply, fill #0

## 2022-12-14 ENCOUNTER — Other Ambulatory Visit (HOSPITAL_COMMUNITY): Payer: Self-pay

## 2022-12-14 ENCOUNTER — Other Ambulatory Visit (HOSPITAL_BASED_OUTPATIENT_CLINIC_OR_DEPARTMENT_OTHER): Payer: Self-pay

## 2022-12-14 MED ORDER — DEXMETHYLPHENIDATE HCL ER 15 MG PO CP24
15.0000 mg | ORAL_CAPSULE | Freq: Every day | ORAL | 0 refills | Status: DC | PRN
  Filled 2022-12-14 – 2022-12-15 (×2): qty 30, 30d supply, fill #0

## 2022-12-14 MED ORDER — WEGOVY 2.4 MG/0.75ML ~~LOC~~ SOAJ
2.4000 mg | SUBCUTANEOUS | 3 refills | Status: DC
  Filled 2022-12-14 – 2023-01-11 (×2): qty 3, 28d supply, fill #0
  Filled 2023-02-08: qty 3, 28d supply, fill #1
  Filled 2023-03-09 – 2023-03-14 (×2): qty 3, 28d supply, fill #2
  Filled 2023-04-06: qty 3, 28d supply, fill #3

## 2022-12-15 ENCOUNTER — Other Ambulatory Visit (HOSPITAL_BASED_OUTPATIENT_CLINIC_OR_DEPARTMENT_OTHER): Payer: Self-pay

## 2023-01-12 ENCOUNTER — Other Ambulatory Visit (HOSPITAL_BASED_OUTPATIENT_CLINIC_OR_DEPARTMENT_OTHER): Payer: Self-pay

## 2023-01-16 ENCOUNTER — Other Ambulatory Visit (HOSPITAL_BASED_OUTPATIENT_CLINIC_OR_DEPARTMENT_OTHER): Payer: Self-pay

## 2023-01-17 ENCOUNTER — Other Ambulatory Visit (HOSPITAL_BASED_OUTPATIENT_CLINIC_OR_DEPARTMENT_OTHER): Payer: Self-pay

## 2023-01-17 MED ORDER — DEXMETHYLPHENIDATE HCL ER 15 MG PO CP24
15.0000 mg | ORAL_CAPSULE | Freq: Every day | ORAL | 0 refills | Status: DC | PRN
  Filled 2023-01-17: qty 30, 30d supply, fill #0

## 2023-01-19 ENCOUNTER — Other Ambulatory Visit (HOSPITAL_BASED_OUTPATIENT_CLINIC_OR_DEPARTMENT_OTHER): Payer: Self-pay

## 2023-01-25 ENCOUNTER — Other Ambulatory Visit (HOSPITAL_BASED_OUTPATIENT_CLINIC_OR_DEPARTMENT_OTHER): Payer: Self-pay

## 2023-01-25 MED ORDER — PROGESTERONE MICRONIZED 100 MG PO CAPS
300.0000 mg | ORAL_CAPSULE | Freq: Every day | ORAL | 1 refills | Status: DC
  Filled 2023-01-25: qty 270, 90d supply, fill #0
  Filled 2023-04-25: qty 270, 90d supply, fill #1

## 2023-01-26 ENCOUNTER — Other Ambulatory Visit: Payer: Self-pay

## 2023-01-26 ENCOUNTER — Other Ambulatory Visit (HOSPITAL_BASED_OUTPATIENT_CLINIC_OR_DEPARTMENT_OTHER): Payer: Self-pay

## 2023-01-26 MED ORDER — JORNAY PM 60 MG PO CP24
60.0000 mg | ORAL_CAPSULE | Freq: Every day | ORAL | 0 refills | Status: DC
  Filled 2023-01-26: qty 30, 30d supply, fill #0

## 2023-01-27 ENCOUNTER — Other Ambulatory Visit (HOSPITAL_BASED_OUTPATIENT_CLINIC_OR_DEPARTMENT_OTHER): Payer: Self-pay

## 2023-02-07 ENCOUNTER — Other Ambulatory Visit (HOSPITAL_COMMUNITY): Payer: Self-pay

## 2023-02-07 ENCOUNTER — Other Ambulatory Visit (HOSPITAL_BASED_OUTPATIENT_CLINIC_OR_DEPARTMENT_OTHER): Payer: Self-pay

## 2023-02-28 ENCOUNTER — Ambulatory Visit: Payer: Federal, State, Local not specified - PPO | Admitting: Allergy and Immunology

## 2023-02-28 ENCOUNTER — Encounter: Payer: Self-pay | Admitting: Allergy and Immunology

## 2023-02-28 ENCOUNTER — Other Ambulatory Visit: Payer: Self-pay

## 2023-02-28 VITALS — BP 126/86 | HR 79 | Temp 98.0°F | Resp 18 | Ht 66.54 in | Wt 187.2 lb

## 2023-02-28 DIAGNOSIS — H6993 Unspecified Eustachian tube disorder, bilateral: Secondary | ICD-10-CM

## 2023-02-28 DIAGNOSIS — J4520 Mild intermittent asthma, uncomplicated: Secondary | ICD-10-CM

## 2023-02-28 DIAGNOSIS — K219 Gastro-esophageal reflux disease without esophagitis: Secondary | ICD-10-CM

## 2023-02-28 DIAGNOSIS — J3089 Other allergic rhinitis: Secondary | ICD-10-CM | POA: Diagnosis not present

## 2023-02-28 MED ORDER — FAMOTIDINE 20 MG PO TABS: 40.0000 mg | ORAL_TABLET | Freq: Every evening | ORAL | 3 refills | Status: DC

## 2023-02-28 MED ORDER — FLUTICASONE PROPIONATE 50 MCG/ACT NA SUSP: 2.0000 | Freq: Every day | NASAL | 3 refills | Status: DC

## 2023-02-28 MED ORDER — LEVOCETIRIZINE DIHYDROCHLORIDE 5 MG PO TABS: 5.0000 mg | ORAL_TABLET | Freq: Every day | ORAL | 3 refills | Status: DC | PRN

## 2023-02-28 MED ORDER — AIRSUPRA 90-80 MCG/ACT IN AERO: 2.0000 | INHALATION_SPRAY | RESPIRATORY_TRACT | 1 refills | Status: DC | PRN

## 2023-02-28 NOTE — Progress Notes (Unsigned)
[place] - [place] - [place] - [place] - [place]   Follow-up Note   Referring Provider: Lorenda Ishihara,* Primary Provider: Lorenda Ishihara, MD Date of Office Visit: 02/28/2023  Subjective:   Wendy Shelton (DOB: November 02, 1970) is a 52 y.o. female who [facility] in re-evaluation of the following:  HPI: [name] returns to this clinic in evaluation of asthma, allergic rhinitis, LPR.  Her last visit to this clinic was 22 February 2022.  She has really done well with her airway while using Flonase on most days and rarely if ever using a short acting bronchodilator.  She has not required a systemic steroid or an antibiotic for any type of airway issue.  She can exercise without any problem.  Since she has started a new ADHD medicine she has noticed some intermittent issues with some shortness of breath but this appears to occur whenever she gets stressed.  Her reflux is active.  She stopped her omeprazole because she read that it causes osteoporosis.  She intermittently uses some famotidine.  She is very careful about caffeine and chocolate consumption.  Since she has started [facility] she has had more reflux.  She has had some problems with intermittent ear popping and feeling as though they are "squishy".  She has not had a defect in her hearing.  She is age 60 and she has obtained a colonoscopy.  She has not had a bone density scan.  Allergies as of 02/28/2023       Reactions   Penicillins Hives   Sulfa Antibiotics Rash   Rash   Amoxicillin Rash   Cephalexin Rash   Penicillin G Nausea And Vomiting   Sulfur Nausea And Vomiting        Medication List    albuterol 108 (90 Base) MCG/ACT inhaler Commonly known as: VENTOLIN HFA Inhale 1-2 puffs into the lungs every 4 (four) hours as needed for wheezing or shortness of breath.   ALPRAZolam 0.5 MG tablet Commonly known as: XANAX Take 0.5 mg by mouth as needed.    buPROPion 300 MG 24 hr tablet Commonly known as: WELLBUTRIN XL Take 1 tablet by mouth every morning.   buPROPion 150 MG 24 hr tablet Commonly known as: WELLBUTRIN XL Take 1 tablet (150 mg total) by mouth daily.   buPROPion 300 MG 24 hr tablet Commonly known as: WELLBUTRIN XL Take 1 tablet (300 mg total) by mouth daily.   dexmethylphenidate 20 MG 24 hr capsule Commonly known as: FOCALIN XR Take 15 mg by mouth daily.   dexmethylphenidate 15 MG 24 hr capsule Commonly known as: Focalin XR Take 1 capsule (15 mg total) by mouth daily as needed/rare.   dexmethylphenidate 15 MG 24 hr capsule Commonly known as: Focalin XR Take 1 capsule by mouth daily as needed.   dexmethylphenidate 15 MG 24 hr capsule Commonly known as: Focalin XR Take 1 capsule (15 mg total) by mouth daily as needed.   dexmethylphenidate 15 MG 24 hr capsule Commonly known as: Focalin XR Take 1 capsule (15 mg total) by mouth daily as needed.   famotidine 20 MG tablet Commonly known as: PEPCID Take 2 tablets (40 mg total) by mouth at bedtime.   fluconazole 150 MG tablet Commonly known as: DIFLUCAN Take 150 mg by mouth daily.   fluticasone 50 MCG/ACT nasal spray Commonly known as: FLONASE Place 2 sprays into both nostrils daily.   hydrocortisone 2.5 % ointment apply topically one to two times daily for  14   Jornay PM 60 MG Cp24 Generic drug: Methylphenidate HCl ER (PM) Take 1 capsule (60 mg total) by mouth at bedtime.   levocetirizine 5 MG tablet Commonly known as: XYZAL Take 1 tablet (5 mg total) by mouth daily as needed (Can take an extra dose during flare ups.).   loratadine 10 MG tablet Commonly known as: CLARITIN TAKE 1 TABLET BY MOUTH TWICE DAILY AS NEEDED FOR ALLERGIES. CAN TAKE EXTRA DOSE DURING FLARE UPS.   NP Thyroid 60 MG tablet Generic drug: thyroid Take 60 mg by mouth daily.   omeprazole 20 MG tablet Commonly known as: PriLOSEC OTC Take 2 tablets (40 mg total) by mouth every  morning. Takes one to two tablets   Ozempic (0.25 or 0.5 MG/DOSE) 2 MG/1.5ML Sopn Generic drug: Semaglutide(0.25 or 0.5MG /DOS) Inject into the skin.   progesterone 100 MG capsule Commonly known as: PROMETRIUM Take 100 mg by mouth at bedtime.   progesterone 100 MG capsule Commonly known as: PROMETRIUM Take 2 capsules (200 mg total) by mouth at bedtime. Stop for menses   progesterone 100 MG capsule Commonly known as: PROMETRIUM Take 3 capsules (300 mg total) by mouth at bedtime. Stop for menses   valACYclovir 1000 MG tablet Commonly known as: VALTREX TAKE 2 TABLETS BY MOUTH 2 TIMES DAILY FOR 3-7 DAYS AS NEEDED   Vitamin B-12 5000 MCG Tbdp Take 1 tablet by mouth daily at 12 noon.   Wegovy 0.25 MG/0.5ML Soaj Generic drug: Semaglutide-Weight Management Inject 0.25 mg into the skin once a week.   Wegovy 0.5 MG/0.5ML Soaj Generic drug: Semaglutide-Weight Management Inject 0.5 mg every week by subcutaneous route for 28 days.   Wegovy 1 MG/0.5ML Soaj Generic drug: Semaglutide-Weight Management Inject 1 mg into the skin once a week.   Wegovy 1 MG/0.5ML Soaj Generic drug: Semaglutide-Weight Management Inject 1 mg into the skin once a week.   Wegovy 1.7 MG/0.75ML Soaj Generic drug: Semaglutide-Weight Management Inject 1.7 mg into the skin once a week.   Wegovy 2.4 MG/0.75ML Soaj Generic drug: Semaglutide-Weight Management Inject 2.4 mg into the skin once a week.    Past Medical History:  Diagnosis Date   ADHD    Anxiety    Asthma     Past Surgical History:  Procedure Laterality Date   FOOT SURGERY     hammer toe and bunion    Review of systems negative except as noted in HPI / PMHx or noted below:  Review of Systems  Constitutional: Negative.   HENT: Negative.    Eyes: Negative.   Respiratory: Negative.    Cardiovascular: Negative.   Gastrointestinal: Negative.   Genitourinary: Negative.   Musculoskeletal: Negative.   Skin: Negative.   Neurological:  Negative.   Endo/Heme/Allergies: Negative.   Psychiatric/Behavioral: Negative.       Objective:   Vitals:   02/28/23 0834  BP: 126/86  Pulse: 79  Resp: 18  Temp: 98 F (36.7 C)  SpO2: 100%   Height: 5' 6.54" (169 cm)  Weight: 187 lb 3.2 oz (84.9 kg)   Physical Exam Constitutional:      Appearance: She is not diaphoretic.  HENT:     Head: Normocephalic.     Right Ear: Tympanic membrane, ear canal and external ear normal.     Left Ear: Tympanic membrane, ear canal and external ear normal.     Nose: Nose normal. No mucosal edema or rhinorrhea.     Mouth/Throat:     Pharynx: Uvula midline. No oropharyngeal exudate.  Eyes:     Conjunctiva/sclera: Conjunctivae normal.  Neck:     Thyroid: No thyromegaly.     Trachea: Trachea normal. No tracheal tenderness or tracheal deviation.  Cardiovascular:     Rate and Rhythm: Normal rate and regular rhythm.     Heart sounds: Normal heart sounds, S1 normal and S2 normal. No murmur heard. Pulmonary:     Effort: No respiratory distress.     Breath sounds: Normal breath sounds. No stridor. No wheezing or rales.  Lymphadenopathy:     Head:     Right side of head: No tonsillar adenopathy.     Left side of head: No tonsillar adenopathy.     Cervical: No cervical adenopathy.  Skin:    Findings: No erythema or rash.     Nails: There is no clubbing.  Neurological:     Mental Status: She is alert.     Diagnostics: none  .    Assessment and Plan:   1. Asthma, mild intermittent, well-controlled   2. Perennial and seasonal allergic rhinitis   3. LPRD (laryngopharyngeal reflux disease)   4. Dysfunction of both eustachian tubes    1.  Continue to Treat and prevent inflammation:   A. Flonase - 1-2 sprays each nostril 3-7 times per week  2.  Continue to treat and prevent reflux/LPR:   A. Consolidate caffeine including chocolate  B. Famotidine 40 mg - 1 tablet 1 time per day   C. Replace throat clearing with swallowing  maneuver  3.  If needed:   A. OTC antihistamine  B. Airsupra - 2 inhalations every 4-6 hours (replaces albuterol)  C. Positive pressure inflation of ears  4.  Return to clinic in 12 months or earlier if problem   5. Obtain fall flu vaccine  6. Obtain a bone density scan  [name] appears to be doing well with her upper airway issue but she is having some ETD which I suspect is most likely secondary to her reflux disease especially given the fact that she has stopped her omeprazole.  I did ask her to consistently use famotidine and replacement of her omeprazole as she believes that omeprazole causes osteoporosis.  I did discuss with her the case cohort studies that are in existence regarding the association of osteoporosis and proton pump inhibitor use.  I have asked her to obtain a bone density scan.  She can use an anti-inflammatory rescue medicine as needed for her asthma.  I will see her back in this clinic in 1 year or earlier if there is a problem.  Laurette Schimke, MD Allergy / Immunology [facility]

## 2023-02-28 NOTE — Patient Instructions (Addendum)
  1.  Continue to Treat and prevent inflammation:   A. Flonase - 1-2 sprays each nostril 3-7 times per week  2.  Continue to treat and prevent reflux/LPR:   A. Consolidate caffeine including chocolate  B. Famotidine 40 mg - 1 tablet 1 time per day   C. Replace throat clearing with swallowing maneuver  3.  If needed:   A. OTC antihistamine  B. Airsupra - 2 inhalations every 4-6 hours (replaces albuterol)  C. Positive pressure inflation of ears  4.  Return to clinic in 12 months or earlier if problem   5. Obtain fall flu vaccine  6. Obtain a bone density scan

## 2023-03-01 ENCOUNTER — Encounter: Payer: Self-pay | Admitting: Allergy and Immunology

## 2023-03-07 ENCOUNTER — Other Ambulatory Visit (HOSPITAL_BASED_OUTPATIENT_CLINIC_OR_DEPARTMENT_OTHER): Payer: Self-pay

## 2023-03-07 MED ORDER — JORNAY PM 80 MG PO CP24
80.0000 mg | ORAL_CAPSULE | Freq: Every day | ORAL | 0 refills | Status: DC
  Filled 2023-03-07: qty 30, 30d supply, fill #0

## 2023-03-14 ENCOUNTER — Other Ambulatory Visit (HOSPITAL_BASED_OUTPATIENT_CLINIC_OR_DEPARTMENT_OTHER): Payer: Self-pay

## 2023-03-18 ENCOUNTER — Other Ambulatory Visit (HOSPITAL_COMMUNITY): Payer: Self-pay

## 2023-03-22 ENCOUNTER — Other Ambulatory Visit (HOSPITAL_COMMUNITY): Payer: Self-pay

## 2023-03-23 ENCOUNTER — Other Ambulatory Visit (HOSPITAL_COMMUNITY): Payer: Self-pay

## 2023-03-27 ENCOUNTER — Other Ambulatory Visit (HOSPITAL_BASED_OUTPATIENT_CLINIC_OR_DEPARTMENT_OTHER): Payer: Self-pay

## 2023-03-27 MED ORDER — THYROID 30 MG PO TABS
30.0000 mg | ORAL_TABLET | Freq: Every day | ORAL | 3 refills | Status: DC
  Filled 2023-03-27: qty 30, 30d supply, fill #0
  Filled 2023-04-30: qty 30, 30d supply, fill #1
  Filled 2023-07-12: qty 30, 30d supply, fill #2
  Filled 2023-08-21: qty 30, 30d supply, fill #3

## 2023-03-27 MED ORDER — ESTRADIOL 0.5 MG PO TABS
0.2500 mg | ORAL_TABLET | Freq: Every day | ORAL | 3 refills | Status: DC
  Filled 2023-03-27: qty 15, 30d supply, fill #0

## 2023-04-05 ENCOUNTER — Other Ambulatory Visit: Payer: Self-pay | Admitting: Allergy and Immunology

## 2023-04-05 ENCOUNTER — Telehealth: Payer: Self-pay | Admitting: Allergy and Immunology

## 2023-04-05 NOTE — Telephone Encounter (Signed)
Patient stated she requested her Levocetirizine to be filled but was denied. She stated she wanted a call back for what the alternative maybe. Patient stated she wanted it sent over to the [facility] at [address].  Patient also stated that she is wondering why she is no longer prescribed the Loratadine as she had taken it before.  Best Contact: [phone]

## 2023-04-05 NOTE — Telephone Encounter (Signed)
Medication was sent in by another CMA today. However, I spoke to the patient and she states that the insurance will not cover it. Advised patient that if it is an over-the-counter medication that most insurances will not cover the medication because it is available over-the-counter. Also advised patient that per Dr. Kathyrn Lass note, it does not specifically state which OTC antihistamine that she needs to continue and she should be switching them every 3 to 6 months anyway.  Patient stated that she will just by it OTC instead of trying to get it as a prescription.

## 2023-04-06 ENCOUNTER — Other Ambulatory Visit: Payer: Self-pay

## 2023-04-06 ENCOUNTER — Other Ambulatory Visit (HOSPITAL_BASED_OUTPATIENT_CLINIC_OR_DEPARTMENT_OTHER): Payer: Self-pay

## 2023-04-06 MED ORDER — JORNAY PM 100 MG PO CP24
100.0000 mg | ORAL_CAPSULE | Freq: Every day | ORAL | 0 refills | Status: DC
  Filled 2023-04-06: qty 30, 30d supply, fill #0

## 2023-04-21 ENCOUNTER — Other Ambulatory Visit (HOSPITAL_BASED_OUTPATIENT_CLINIC_OR_DEPARTMENT_OTHER): Payer: Self-pay

## 2023-04-24 ENCOUNTER — Other Ambulatory Visit (HOSPITAL_BASED_OUTPATIENT_CLINIC_OR_DEPARTMENT_OTHER): Payer: Self-pay

## 2023-04-24 MED ORDER — SEMAGLUTIDE-WEIGHT MANAGEMENT 2.4 MG/0.75ML ~~LOC~~ SOAJ
2.4000 mg | SUBCUTANEOUS | 0 refills | Status: AC
  Filled 2023-04-24 – 2023-04-25 (×2): qty 3, 28d supply, fill #0
  Filled ????-??-??: fill #0

## 2023-04-26 ENCOUNTER — Other Ambulatory Visit (HOSPITAL_BASED_OUTPATIENT_CLINIC_OR_DEPARTMENT_OTHER): Payer: Self-pay

## 2023-04-26 ENCOUNTER — Other Ambulatory Visit: Payer: Self-pay

## 2023-04-27 ENCOUNTER — Other Ambulatory Visit (HOSPITAL_BASED_OUTPATIENT_CLINIC_OR_DEPARTMENT_OTHER): Payer: Self-pay

## 2023-04-27 MED ORDER — WEGOVY 2.4 MG/0.75ML ~~LOC~~ SOAJ
2.4000 mg | SUBCUTANEOUS | 1 refills | Status: AC
  Filled 2023-04-27 (×2): qty 3, 28d supply, fill #0
  Filled 2023-05-21: qty 3, 28d supply, fill #1
  Filled 2023-06-19: qty 3, 28d supply, fill #2

## 2023-04-28 ENCOUNTER — Other Ambulatory Visit (HOSPITAL_BASED_OUTPATIENT_CLINIC_OR_DEPARTMENT_OTHER): Payer: Self-pay

## 2023-04-30 ENCOUNTER — Other Ambulatory Visit (HOSPITAL_BASED_OUTPATIENT_CLINIC_OR_DEPARTMENT_OTHER): Payer: Self-pay

## 2023-05-01 ENCOUNTER — Other Ambulatory Visit (HOSPITAL_BASED_OUTPATIENT_CLINIC_OR_DEPARTMENT_OTHER): Payer: Self-pay

## 2023-05-01 ENCOUNTER — Other Ambulatory Visit: Payer: Self-pay

## 2023-05-01 MED ORDER — JORNAY PM 100 MG PO CP24
1.0000 | ORAL_CAPSULE | Freq: Every day | ORAL | 0 refills | Status: DC
  Filled 2023-05-04: qty 30, 30d supply, fill #0

## 2023-05-04 ENCOUNTER — Other Ambulatory Visit: Payer: Self-pay

## 2023-05-04 ENCOUNTER — Other Ambulatory Visit (HOSPITAL_COMMUNITY): Payer: Self-pay

## 2023-05-04 ENCOUNTER — Other Ambulatory Visit (HOSPITAL_BASED_OUTPATIENT_CLINIC_OR_DEPARTMENT_OTHER): Payer: Self-pay

## 2023-06-03 ENCOUNTER — Other Ambulatory Visit (HOSPITAL_BASED_OUTPATIENT_CLINIC_OR_DEPARTMENT_OTHER): Payer: Self-pay

## 2023-06-09 ENCOUNTER — Other Ambulatory Visit (HOSPITAL_BASED_OUTPATIENT_CLINIC_OR_DEPARTMENT_OTHER): Payer: Self-pay

## 2023-06-10 ENCOUNTER — Other Ambulatory Visit (HOSPITAL_BASED_OUTPATIENT_CLINIC_OR_DEPARTMENT_OTHER): Payer: Self-pay

## 2023-06-10 MED ORDER — JORNAY PM 100 MG PO CP24
1.0000 | ORAL_CAPSULE | Freq: Every day | ORAL | 0 refills | Status: DC
  Filled 2023-06-10: qty 30, 30d supply, fill #0

## 2023-06-12 ENCOUNTER — Other Ambulatory Visit (HOSPITAL_BASED_OUTPATIENT_CLINIC_OR_DEPARTMENT_OTHER): Payer: Self-pay

## 2023-06-20 ENCOUNTER — Other Ambulatory Visit: Payer: Self-pay

## 2023-06-23 ENCOUNTER — Other Ambulatory Visit (HOSPITAL_BASED_OUTPATIENT_CLINIC_OR_DEPARTMENT_OTHER): Payer: Self-pay

## 2023-06-23 MED ORDER — ESTRADIOL 0.025 MG/24HR TD PTTW
1.0000 | MEDICATED_PATCH | TRANSDERMAL | 2 refills | Status: DC
  Filled 2023-06-23: qty 8, 24d supply, fill #0
  Filled 2023-07-12: qty 8, 24d supply, fill #1
  Filled 2023-08-19 – 2023-08-22 (×2): qty 8, 24d supply, fill #2

## 2023-06-28 ENCOUNTER — Other Ambulatory Visit: Payer: Self-pay

## 2023-06-28 ENCOUNTER — Other Ambulatory Visit (HOSPITAL_BASED_OUTPATIENT_CLINIC_OR_DEPARTMENT_OTHER): Payer: Self-pay

## 2023-06-28 ENCOUNTER — Other Ambulatory Visit: Payer: Self-pay | Admitting: *Deleted

## 2023-06-28 ENCOUNTER — Encounter: Payer: Self-pay | Admitting: Allergy and Immunology

## 2023-06-28 MED ORDER — FLUTICASONE PROPIONATE 50 MCG/ACT NA SUSP
2.0000 | Freq: Every day | NASAL | 1 refills | Status: DC
  Filled 2023-06-28: qty 48, 84d supply, fill #0

## 2023-06-29 ENCOUNTER — Encounter: Payer: Self-pay | Admitting: Allergy and Immunology

## 2023-06-29 ENCOUNTER — Other Ambulatory Visit: Payer: Self-pay

## 2023-06-29 ENCOUNTER — Ambulatory Visit: Payer: Federal, State, Local not specified - PPO | Admitting: Allergy

## 2023-06-29 ENCOUNTER — Encounter: Payer: Self-pay | Admitting: Allergy

## 2023-06-29 VITALS — BP 118/76 | HR 70 | Temp 98.2°F | Ht 66.0 in | Wt 182.2 lb

## 2023-06-29 DIAGNOSIS — J3089 Other allergic rhinitis: Secondary | ICD-10-CM | POA: Diagnosis not present

## 2023-06-29 DIAGNOSIS — H6993 Unspecified Eustachian tube disorder, bilateral: Secondary | ICD-10-CM

## 2023-06-29 DIAGNOSIS — K219 Gastro-esophageal reflux disease without esophagitis: Secondary | ICD-10-CM | POA: Diagnosis not present

## 2023-06-29 DIAGNOSIS — J4520 Mild intermittent asthma, uncomplicated: Secondary | ICD-10-CM

## 2023-06-29 MED ORDER — FLUTICASONE PROPIONATE 50 MCG/ACT NA SUSP: 2.0000 | Freq: Every day | NASAL | 1 refills | Status: AC

## 2023-06-29 MED ORDER — AIRSUPRA 90-80 MCG/ACT IN AERO: 2.0000 | INHALATION_SPRAY | RESPIRATORY_TRACT | 1 refills | Status: AC | PRN

## 2023-06-29 MED ORDER — IPRATROPIUM BROMIDE 0.06 % NA SOLN: 2.0000 | Freq: Two times a day (BID) | NASAL | 5 refills | Status: AC

## 2023-06-29 MED ORDER — FAMOTIDINE 20 MG PO TABS: 40.0000 mg | ORAL_TABLET | Freq: Every evening | ORAL | 3 refills | Status: DC

## 2023-06-29 NOTE — Patient Instructions (Addendum)
  1.  Continue to Treat and prevent inflammation:   A. Start nasal Atrovent  0.06% 2 sprays each nostril twice a day at this time.  Can use up to 4 times a day as needed for nasal drainage control  2.  Continue to treat and prevent reflux/LPR:   A. Consolidate caffeine including chocolate  B. Famotidine  40 mg - 1 tablet 1 time per day   C. Replace throat clearing with swallowing maneuver  3.  If needed:   A. OTC antihistamine  B. Airsupra  - 2 inhalations every 4-6 hours (replaces albuterol ). Sample provided  C. Positive pressure inflation of ears  D. Flonase  - 1-2 sprays each nostril 3-7 times per week for nasal congestion  4.  Return to clinic in 12 months or earlier if problem

## 2023-06-29 NOTE — Progress Notes (Signed)
 Follow-up Note  RE: Wendy Shelton MRN: [id number] DOB: Sep 16, 1970 Date of Office Visit: 06/29/2023   History of present illness: Wendy Shelton is a 53 y.o. female presenting today for sick visit.  She has history of asthma, allergic rhinitis, eustachian tube dysfunction and LPRD.  She was last seen in the office on 02/28/2023 by Dr. Maurilio. Discussed the use of AI scribe software for clinical note transcription with the patient, who gave verbal consent to proceed.  She experiences persistent throat soreness and cough, particularly bothersome at night. The soreness is localized to the side of her throat and ear, with a cough triggered by cold air and mucus drainage that worsens at night, causing her to wake up coughing. During the day, she feels fine, but at night, she experiences throat pain, especially when swallowing, and drainage that she cannot clear.  Her symptoms began after a cold and sinus congestion in December, which lasted a couple of weeks and resolved with a steroid shot and over-the-counter medications like Mucinex. After Christmas, symptoms recurred, and she was treated with an antibiotic, likely doxycycline, which helped resolve the issue.  She has not been using her Flonase  nasal spray regularly over the past month but plans to resume its use. She took Sudafed yesterday, which she believes may have contributed to a hoarse cough at night. She woke up at 2:30 AM with a dry, hoarse cough and feels frustrated with the persistent symptoms.  She mentions having a runny nose at times, which she attributes to her ADHD medication. No fevers, but she occasionally experiences headaches. She notes that her voice sounds different and attributes it to thick mucus. She recalls using a combination nasal spray in the past, which she found unpleasant due to its taste. She is unsure if she still has an Airsupra  inhaler, which she used with a spacer in the past.      Review of  systems: 10pt ROS negative unless noted above in HPI   All other systems negative unless noted above in HPI  Past medical/social/surgical/family history have been reviewed and are unchanged unless specifically indicated below.  No changes  Medication List: Current Outpatient Medications  Medication Sig Dispense Refill   buPROPion  (WELLBUTRIN  XL) 300 MG 24 hr tablet Take 1 tablet (300 mg total) by mouth daily. 30 tablet 11   Cyanocobalamin  (VITAMIN B-12) 5000 MCG TBDP Take 1 tablet by mouth daily at 12 noon.     estradiol  (VIVELLE -DOT) 0.025 MG/24HR Place 1 patch onto the skin every 3 (three) days. 8 patch 2   famotidine  (PEPCID ) 20 MG tablet Take 2 tablets (40 mg total) by mouth at bedtime. 90 tablet 3   fluconazole (DIFLUCAN) 150 MG tablet Take 150 mg by mouth daily.     fluticasone  (FLONASE ) 50 MCG/ACT nasal spray Place 2 sprays into both nostrils daily. 48 g 1   loratadine  (CLARITIN ) 10 MG tablet TAKE 1 TABLET BY MOUTH TWICE DAILY AS NEEDED FOR ALLERGIES. CAN TAKE EXTRA DOSE DURING FLARE UPS. 60 tablet 5   Methylphenidate  HCl ER, PM, (JORNAY PM ) 100 MG CP24 Take 1 capsule by mouth nightly 30 capsule 0   NP THYROID  60 MG tablet Take 60 mg by mouth daily.     progesterone  (PROMETRIUM ) 100 MG capsule Take 2 capsules (200 mg total) by mouth at bedtime. Stop for menses 180 capsule 1   Semaglutide -Weight Management (WEGOVY ) 2.4 MG/0.75ML SOAJ Inject 2.4 mg into the skin every 7 (seven) days. 9 mL 1  albuterol  (VENTOLIN  HFA) 108 (90 Base) MCG/ACT inhaler Inhale 1-2 puffs into the lungs every 4 (four) hours as needed for wheezing or shortness of breath. (Patient not taking: Reported on 06/29/2023) 18 g 2   Albuterol -Budesonide  (AIRSUPRA ) 90-80 MCG/ACT AERO Inhale 2 Inhalations into the lungs every 4 (four) hours as needed. (Patient not taking: Reported on 06/29/2023) 11 g 1   ALPRAZolam (XANAX) 0.5 MG tablet Take 0.5 mg by mouth as needed.  (Patient not taking: Reported on 06/29/2023)      dexmethylphenidate  (FOCALIN  XR) 15 MG 24 hr capsule Take 1 capsule (15 mg total) by mouth daily as needed/rare. (Patient not taking: Reported on 06/29/2023) 30 capsule 0   dexmethylphenidate  (FOCALIN  XR) 15 MG 24 hr capsule Take 1 capsule by mouth daily as needed. (Patient not taking: Reported on 06/29/2023) 30 capsule 0   dexmethylphenidate  (FOCALIN  XR) 15 MG 24 hr capsule Take 1 capsule (15 mg total) by mouth daily as needed. (Patient not taking: Reported on 06/29/2023) 30 capsule 0   dexmethylphenidate  (FOCALIN  XR) 15 MG 24 hr capsule Take 1 capsule (15 mg total) by mouth daily as needed. (Patient not taking: Reported on 06/29/2023) 30 capsule 0   dexmethylphenidate  (FOCALIN  XR) 20 MG 24 hr capsule Take 15 mg by mouth daily.  (Patient not taking: Reported on 06/29/2023)  0   hydrocortisone 2.5 % ointment apply topically one to two times daily for 14 (Patient not taking: Reported on 06/29/2023)     levocetirizine (XYZAL ) 5 MG tablet TAKE 1 TABLET BY MOUTH DAILY AS NEEDED. CAN TAKE AN EXTRA DOSE DURING FLARE UPS (Patient not taking: Reported on 06/29/2023) 60 tablet 1   Methylphenidate  HCl ER, PM, (JORNAY PM ) 80 MG CP24 Take 1 capsule (80 mg total) by mouth at bedtime. (Patient not taking: Reported on 06/29/2023) 30 capsule 0   omeprazole  (PRILOSEC  OTC) 20 MG tablet Take 2 tablets (40 mg total) by mouth every morning. Takes one to two tablets (Patient not taking: Reported on 06/29/2023) 30 tablet 11   OZEMPIC , 0.25 OR 0.5 MG/DOSE, 2 MG/1.5ML SOPN Inject into the skin. (Patient not taking: Reported on 06/29/2023)     Semaglutide -Weight Management (WEGOVY ) 0.5 MG/0.5ML SOAJ Inject 0.5 mg every week by subcutaneous route for 28 days. (Patient not taking: Reported on 06/29/2023) 2 mL 1   Semaglutide -Weight Management (WEGOVY ) 1 MG/0.5ML SOAJ Inject 1 mg into the skin once a week. (Patient not taking: Reported on 06/29/2023) 2 mL 2   Semaglutide -Weight Management (WEGOVY ) 1.7 MG/0.75ML SOAJ Inject 1.7 mg into the skin once a week.  (Patient not taking: Reported on 06/29/2023) 3 mL 2   Semaglutide -Weight Management (WEGOVY ) 2.4 MG/0.75ML SOAJ Inject 2.4 mg into the skin once a week. (Patient not taking: Reported on 06/29/2023) 3 mL 0   Semaglutide -Weight Management 0.25 MG/0.5ML SOAJ Inject 0.25 mg into the skin once a week. (Patient not taking: Reported on 06/29/2023) 2 mL 2   Semaglutide -Weight Management 1 MG/0.5ML SOAJ Inject 1 mg into the skin once a week. (Patient not taking: Reported on 06/29/2023) 2 mL 1   Testosterone  1.62 % GEL Place onto the skin.     thyroid  (NP THYROID ) 30 MG tablet Take 1 tablet (30 mg total) by mouth on an empty stomach once daily in the afternoon 30 tablet 3   TURMERIC PO Take by mouth.     valACYclovir (VALTREX) 1000 MG tablet TAKE 2 TABLETS BY MOUTH 2 TIMES DAILY FOR 3-7 DAYS AS NEEDED  0   No current  facility-administered medications for this visit.     Known medication allergies: Allergies  Allergen Reactions   Penicillins Hives   Sulfa Antibiotics Rash    Rash    Amoxicillin Rash   Cephalexin Rash   Penicillin G Nausea And Vomiting   Sulfur Nausea And Vomiting     Physical examination: Blood pressure 118/76, pulse 70, temperature 98.2 F (36.8 C), temperature source Temporal, height 5' 6 (1.676 m), weight 182 lb 3.2 oz (82.6 kg), SpO2 100%.  General: Alert, interactive, in no acute distress. HEENT: PERRLA, TMs pearly gray, turbinates minimally edematous with clear discharge, post-pharynx non erythematous. Neck: Supple without lymphadenopathy. Lungs: Clear to auscultation without wheezing, rhonchi or rales. {no increased work of breathing. CV: Normal S1, S2 without murmurs. Abdomen: Nondistended, nontender. Skin: Warm and dry, without lesions or rashes. Extremities:  No clubbing, cyanosis or edema. Neuro:   Grossly intact.  Diagnositics/Labs:  Spirometry: FEV1: 2.89L 98%, FVC: 3.52L 95%, ratio consistent with nonobsructive pattern  Assessment and plan: Asthma Allergic  rhinitis Eustachian tube dysfunction LPRD  She seems to currently have inflammation of her sinus tract likely leading to increased nasal mucus production, postnasal drip and her respiratory symptoms   1.  Continue to Treat and prevent inflammation:   A. Start nasal Atrovent  0.06% 2 sprays each nostril twice a day at this time.  Can use up to 4 times a day as needed for nasal drainage control  2.  Continue to treat and prevent reflux/LPR:   A. Consolidate caffeine including chocolate  B. Famotidine  40 mg - 1 tablet 1 time per day   C. Replace throat clearing with swallowing maneuver  3.  If needed:   A. OTC antihistamine  B. Airsupra  - 2 inhalations every 4-6 hours (replaces albuterol ). Sample provided  C. Positive pressure inflation of ears  D. Flonase  - 1-2 sprays each nostril 3-7 times per week for nasal congestion  4.  Return to clinic in 12 months or earlier if problem   I appreciate the opportunity to take part in Chyla's care. Please do not hesitate to contact me with questions.  Sincerely,   Danita Brain, MD Allergy/Immunology [facility]

## 2023-06-29 NOTE — Telephone Encounter (Signed)
 Called patient - DOB/NEED Updated DPR - LMOVM to contact office regarding her myChart message.   I will respond to her myChart message as well.  If/When patient call back - please advise she will need to seen for her symptoms - w/another provider since Dr. Kozlow is only in [place] on Tuesdays.

## 2023-06-30 ENCOUNTER — Other Ambulatory Visit (HOSPITAL_BASED_OUTPATIENT_CLINIC_OR_DEPARTMENT_OTHER): Payer: Self-pay

## 2023-07-12 ENCOUNTER — Other Ambulatory Visit (HOSPITAL_BASED_OUTPATIENT_CLINIC_OR_DEPARTMENT_OTHER): Payer: Self-pay

## 2023-07-12 MED ORDER — JORNAY PM 100 MG PO CP24
100.0000 mg | ORAL_CAPSULE | Freq: Every day | ORAL | 0 refills | Status: AC
  Filled 2023-07-12: qty 30, 30d supply, fill #0

## 2023-07-19 ENCOUNTER — Other Ambulatory Visit (HOSPITAL_BASED_OUTPATIENT_CLINIC_OR_DEPARTMENT_OTHER): Payer: Self-pay

## 2023-07-19 MED ORDER — ZEPBOUND 5 MG/0.5ML ~~LOC~~ SOAJ
5.0000 mg | SUBCUTANEOUS | 1 refills | Status: AC
  Filled 2023-07-19: qty 2, 28d supply, fill #0

## 2023-08-12 ENCOUNTER — Encounter: Payer: Self-pay | Admitting: Allergy and Immunology

## 2023-08-14 ENCOUNTER — Other Ambulatory Visit: Payer: Self-pay

## 2023-08-14 MED ORDER — LORATADINE 10 MG PO TABS: 10.0000 mg | ORAL_TABLET | Freq: Every day | ORAL | 5 refills | Status: AC | PRN

## 2023-08-19 ENCOUNTER — Other Ambulatory Visit (HOSPITAL_COMMUNITY): Payer: Self-pay

## 2023-08-21 ENCOUNTER — Encounter (HOSPITAL_COMMUNITY): Payer: Self-pay

## 2023-08-21 ENCOUNTER — Other Ambulatory Visit (HOSPITAL_COMMUNITY): Payer: Self-pay

## 2023-08-21 ENCOUNTER — Other Ambulatory Visit: Payer: Self-pay

## 2023-08-21 MED ORDER — LORATADINE 10 MG PO TABS: 10.0000 mg | ORAL_TABLET | Freq: Every day | ORAL | 5 refills | Status: DC | PRN

## 2023-08-21 MED ORDER — PROGESTERONE MICRONIZED 100 MG PO CAPS
200.0000 mg | ORAL_CAPSULE | Freq: Every day | ORAL | 1 refills | Status: DC
  Filled 2023-08-21 – 2023-08-22 (×3): qty 180, 90d supply, fill #0
  Filled 2023-11-11: qty 180, 90d supply, fill #1

## 2023-08-22 ENCOUNTER — Other Ambulatory Visit (HOSPITAL_BASED_OUTPATIENT_CLINIC_OR_DEPARTMENT_OTHER): Payer: Self-pay

## 2023-08-22 ENCOUNTER — Other Ambulatory Visit: Payer: Self-pay

## 2023-08-22 ENCOUNTER — Other Ambulatory Visit (HOSPITAL_COMMUNITY): Payer: Self-pay

## 2023-08-23 ENCOUNTER — Other Ambulatory Visit: Payer: Self-pay

## 2023-08-23 ENCOUNTER — Other Ambulatory Visit (HOSPITAL_BASED_OUTPATIENT_CLINIC_OR_DEPARTMENT_OTHER): Payer: Self-pay

## 2023-08-24 ENCOUNTER — Encounter (HOSPITAL_BASED_OUTPATIENT_CLINIC_OR_DEPARTMENT_OTHER): Payer: Self-pay

## 2023-08-24 ENCOUNTER — Other Ambulatory Visit (HOSPITAL_COMMUNITY): Payer: Self-pay

## 2023-08-24 ENCOUNTER — Other Ambulatory Visit (HOSPITAL_BASED_OUTPATIENT_CLINIC_OR_DEPARTMENT_OTHER): Payer: Self-pay

## 2023-08-25 ENCOUNTER — Other Ambulatory Visit (HOSPITAL_BASED_OUTPATIENT_CLINIC_OR_DEPARTMENT_OTHER): Payer: Self-pay

## 2023-08-28 ENCOUNTER — Other Ambulatory Visit (HOSPITAL_BASED_OUTPATIENT_CLINIC_OR_DEPARTMENT_OTHER): Payer: Self-pay

## 2023-08-29 ENCOUNTER — Other Ambulatory Visit (HOSPITAL_BASED_OUTPATIENT_CLINIC_OR_DEPARTMENT_OTHER): Payer: Self-pay

## 2023-09-14 ENCOUNTER — Other Ambulatory Visit (HOSPITAL_COMMUNITY): Payer: Self-pay

## 2023-09-15 ENCOUNTER — Other Ambulatory Visit (HOSPITAL_COMMUNITY): Payer: Self-pay

## 2023-09-15 ENCOUNTER — Other Ambulatory Visit: Payer: Self-pay

## 2023-09-15 MED ORDER — ESTRADIOL 0.025 MG/24HR TD PTTW
1.0000 | MEDICATED_PATCH | TRANSDERMAL | 1 refills | Status: DC
Start: 2023-09-15 — End: 2023-11-13
  Filled 2023-09-15: qty 8, 24d supply, fill #0
  Filled 2023-10-13: qty 8, 24d supply, fill #1

## 2023-09-29 ENCOUNTER — Other Ambulatory Visit (HOSPITAL_COMMUNITY): Payer: Self-pay

## 2023-09-29 MED ORDER — BUPROPION HCL ER (XL) 300 MG PO TB24
300.0000 mg | ORAL_TABLET | Freq: Every day | ORAL | 12 refills | Status: AC
  Filled 2023-09-29 – 2023-10-02 (×3): qty 30, 30d supply, fill #0
  Filled 2023-10-30: qty 30, 30d supply, fill #1
  Filled 2023-11-27: qty 30, 30d supply, fill #2
  Filled 2023-12-29: qty 30, 30d supply, fill #3
  Filled 2024-01-30: qty 30, 30d supply, fill #4
  Filled 2024-03-03: qty 30, 30d supply, fill #5
  Filled 2024-03-31: qty 30, 30d supply, fill #6
  Filled 2024-04-28: qty 30, 30d supply, fill #7
  Filled 2024-06-05: qty 30, 30d supply, fill #8

## 2023-10-02 ENCOUNTER — Other Ambulatory Visit (HOSPITAL_COMMUNITY): Payer: Self-pay

## 2023-11-11 ENCOUNTER — Other Ambulatory Visit (HOSPITAL_COMMUNITY): Payer: Self-pay

## 2023-11-13 ENCOUNTER — Other Ambulatory Visit (HOSPITAL_COMMUNITY): Payer: Self-pay

## 2023-11-13 ENCOUNTER — Other Ambulatory Visit: Payer: Self-pay

## 2023-11-13 MED ORDER — ESTRADIOL 0.025 MG/24HR TD PTTW
1.0000 | MEDICATED_PATCH | TRANSDERMAL | 1 refills | Status: DC
  Filled 2023-11-13 (×2): qty 8, 24d supply, fill #0
  Filled 2023-12-02: qty 8, 24d supply, fill #1

## 2023-11-15 ENCOUNTER — Encounter: Payer: Self-pay | Admitting: Allergy and Immunology

## 2023-11-16 MED ORDER — FAMOTIDINE 20 MG PO TABS
40.0000 mg | ORAL_TABLET | Freq: Every evening | ORAL | 3 refills | Status: AC
Start: 2023-11-16 — End: ?

## 2023-11-16 NOTE — Addendum Note (Signed)
 Addended by: Jiovani Mccammon E on: 11/16/2023 01:41 PM   Modules accepted: Orders

## 2023-11-22 ENCOUNTER — Other Ambulatory Visit (HOSPITAL_COMMUNITY): Payer: Self-pay

## 2023-11-22 MED ORDER — THYROID 30 MG PO TABS
30.0000 mg | ORAL_TABLET | Freq: Every day | ORAL | 0 refills | Status: DC
  Filled 2023-11-22: qty 30, 30d supply, fill #0

## 2023-12-16 ENCOUNTER — Other Ambulatory Visit (HOSPITAL_COMMUNITY): Payer: Self-pay

## 2023-12-18 ENCOUNTER — Other Ambulatory Visit: Payer: Self-pay

## 2023-12-18 ENCOUNTER — Other Ambulatory Visit (HOSPITAL_COMMUNITY): Payer: Self-pay

## 2023-12-18 MED ORDER — THYROID 30 MG PO TABS
30.0000 mg | ORAL_TABLET | Freq: Every day | ORAL | 0 refills | Status: DC
  Filled 2023-12-18: qty 30, 30d supply, fill #0

## 2023-12-29 ENCOUNTER — Other Ambulatory Visit (HOSPITAL_COMMUNITY): Payer: Self-pay

## 2023-12-29 ENCOUNTER — Other Ambulatory Visit: Payer: Self-pay

## 2023-12-29 MED ORDER — THYROID 30 MG PO TABS
30.0000 mg | ORAL_TABLET | Freq: Every day | ORAL | 0 refills | Status: AC
  Filled 2023-12-29: qty 30, 30d supply, fill #0

## 2024-01-05 ENCOUNTER — Other Ambulatory Visit (HOSPITAL_COMMUNITY): Payer: Self-pay

## 2024-01-08 ENCOUNTER — Other Ambulatory Visit (HOSPITAL_COMMUNITY): Payer: Self-pay

## 2024-01-08 MED ORDER — ESTRADIOL 0.025 MG/24HR TD PTTW
1.0000 | MEDICATED_PATCH | TRANSDERMAL | 1 refills | Status: DC
  Filled 2024-01-08 (×2): qty 8, 24d supply, fill #0
  Filled 2024-02-08 – 2024-04-08 (×2): qty 8, 24d supply, fill #1

## 2024-01-12 ENCOUNTER — Other Ambulatory Visit (HOSPITAL_BASED_OUTPATIENT_CLINIC_OR_DEPARTMENT_OTHER): Payer: Self-pay

## 2024-01-12 MED ORDER — AIRSUPRA 90-80 MCG/ACT IN AERO
2.0000 | INHALATION_SPRAY | RESPIRATORY_TRACT | 0 refills | Status: AC | PRN
  Filled 2024-01-12: qty 10.7, 30d supply, fill #0

## 2024-01-12 MED ORDER — PAXLOVID (300/100) 20 X 150 MG & 10 X 100MG PO TBPK
3.0000 | ORAL_TABLET | Freq: Two times a day (BID) | ORAL | 0 refills | Status: AC
  Filled 2024-01-12: qty 30, 5d supply, fill #0

## 2024-01-12 MED ORDER — BENZONATATE 200 MG PO CAPS
200.0000 mg | ORAL_CAPSULE | Freq: Three times a day (TID) | ORAL | 0 refills | Status: AC | PRN
  Filled 2024-01-12: qty 20, 7d supply, fill #0

## 2024-01-12 MED ORDER — AZELASTINE HCL 0.1 % NA SOLN
1.0000 | Freq: Two times a day (BID) | NASAL | 0 refills | Status: AC
  Filled 2024-01-12: qty 30, 90d supply, fill #0

## 2024-01-16 ENCOUNTER — Other Ambulatory Visit (HOSPITAL_BASED_OUTPATIENT_CLINIC_OR_DEPARTMENT_OTHER): Payer: Self-pay

## 2024-01-16 MED ORDER — THYROID 90 MG PO TABS
90.0000 mg | ORAL_TABLET | Freq: Every morning | ORAL | 1 refills | Status: AC
  Filled 2024-01-16: qty 90, 90d supply, fill #0
  Filled 2024-04-17: qty 90, 90d supply, fill #1

## 2024-01-22 ENCOUNTER — Other Ambulatory Visit (HOSPITAL_BASED_OUTPATIENT_CLINIC_OR_DEPARTMENT_OTHER): Payer: Self-pay

## 2024-01-23 ENCOUNTER — Other Ambulatory Visit (HOSPITAL_BASED_OUTPATIENT_CLINIC_OR_DEPARTMENT_OTHER): Payer: Self-pay

## 2024-01-23 MED ORDER — PROGESTERONE MICRONIZED 100 MG PO CAPS
200.0000 mg | ORAL_CAPSULE | Freq: Every day | ORAL | 1 refills | Status: AC
  Filled 2024-01-23: qty 180, 90d supply, fill #0
  Filled 2024-03-18 – 2024-03-25 (×2): qty 180, 90d supply, fill #1

## 2024-01-30 ENCOUNTER — Other Ambulatory Visit (HOSPITAL_COMMUNITY): Payer: Self-pay

## 2024-02-08 ENCOUNTER — Other Ambulatory Visit (HOSPITAL_BASED_OUTPATIENT_CLINIC_OR_DEPARTMENT_OTHER): Payer: Self-pay

## 2024-02-08 ENCOUNTER — Other Ambulatory Visit (HOSPITAL_COMMUNITY): Payer: Self-pay

## 2024-02-09 ENCOUNTER — Other Ambulatory Visit (HOSPITAL_COMMUNITY): Payer: Self-pay

## 2024-02-09 ENCOUNTER — Other Ambulatory Visit (HOSPITAL_BASED_OUTPATIENT_CLINIC_OR_DEPARTMENT_OTHER): Payer: Self-pay

## 2024-02-09 ENCOUNTER — Other Ambulatory Visit: Payer: Self-pay

## 2024-02-09 MED ORDER — ESTRADIOL 0.0375 MG/24HR TD PTTW
1.0000 | MEDICATED_PATCH | TRANSDERMAL | 3 refills | Status: AC
  Filled 2024-02-09: qty 8, 24d supply, fill #0
  Filled 2024-03-09: qty 8, 24d supply, fill #1

## 2024-02-09 MED ORDER — ESTRADIOL 0.1 MG/GM VA CREA
2.0000 g | TOPICAL_CREAM | VAGINAL | 3 refills | Status: AC
  Filled 2024-02-09: qty 42.5, 90d supply, fill #0

## 2024-02-27 ENCOUNTER — Ambulatory Visit: Payer: Federal, State, Local not specified - PPO | Admitting: Allergy and Immunology

## 2024-03-03 ENCOUNTER — Other Ambulatory Visit (HOSPITAL_BASED_OUTPATIENT_CLINIC_OR_DEPARTMENT_OTHER): Payer: Self-pay

## 2024-03-04 ENCOUNTER — Other Ambulatory Visit (HOSPITAL_BASED_OUTPATIENT_CLINIC_OR_DEPARTMENT_OTHER): Payer: Self-pay

## 2024-03-05 ENCOUNTER — Ambulatory Visit: Admitting: Allergy and Immunology

## 2024-03-09 ENCOUNTER — Other Ambulatory Visit (HOSPITAL_COMMUNITY): Payer: Self-pay

## 2024-03-15 ENCOUNTER — Ambulatory Visit: Admitting: Family Medicine

## 2024-03-15 ENCOUNTER — Other Ambulatory Visit: Payer: Self-pay

## 2024-03-15 ENCOUNTER — Encounter: Payer: Self-pay | Admitting: Family Medicine

## 2024-03-15 DIAGNOSIS — H6993 Unspecified Eustachian tube disorder, bilateral: Secondary | ICD-10-CM

## 2024-03-15 DIAGNOSIS — J4520 Mild intermittent asthma, uncomplicated: Secondary | ICD-10-CM | POA: Diagnosis not present

## 2024-03-15 DIAGNOSIS — J3089 Other allergic rhinitis: Secondary | ICD-10-CM

## 2024-03-15 DIAGNOSIS — L299 Pruritus, unspecified: Secondary | ICD-10-CM

## 2024-03-15 DIAGNOSIS — K219 Gastro-esophageal reflux disease without esophagitis: Secondary | ICD-10-CM

## 2024-03-15 NOTE — Patient Instructions (Addendum)
  1.  Continue to Treat and prevent inflammation:   A. Continue Flonase - In the right nostril, point the applicator out toward the right ear. In the left nostril, point the applicator out toward the left ear  B. Continue nasal Atrovent  0.06% 2 sprays each nostril twice a day at this time.  Can use up to 4 times a day as needed for nasal drainage control  2.  Continue to treat and prevent reflux/LPR:   A. Consolidate caffeine including chocolate9  B. Famotidine  40 mg - 1 tablet 1 time per day   C. Replace throat clearing with swallowing maneuver  3.  If needed:   A. OTC antihistamine  B. Levalbuterol (Xopenex) - 2 inhalations every 4-6 hours (replaces albuterol ).   C. Positive pressure inflation of ears  D. Flonase  - 1-2 sprays each nostril 3-7 times per week for nasal congestion  4.  Return to clinic in 12 months or earlier if problem

## 2024-03-15 NOTE — Addendum Note (Signed)
 Addended by: NANCEE JON SAILOR on: 03/15/2024 04:49 PM   Modules accepted: Orders

## 2024-03-15 NOTE — Progress Notes (Signed)
 ;  +9  [address] [place] [address] Dept: [phone]  FOLLOW UP NOTE  Patient ID: Wendy Shelton, female    DOB: 24-Jan-1971  Age: 53 y.o. MRN: [id number] Date of Office Visit: 03/15/2024  Assessment  Chief Complaint: Follow-up (Asthma/Allergies/) and Pruritus (X 2 weeks)  HPI Wendy Shelton is a 53 year old female who presents to the clinic for follow-up visit.  She was last seen in this clinic on 06/29/2023 by Dr. Jeneal for evaluation of asthma, allergic rhinitis, eustachian tube dysfunction, and LPR.   Her last food allergy skin testing on 10/26/2016 was negative to the top most allergenic foods.  Discussed the use of AI scribe software for clinical note transcription with the patient, who gave verbal consent to proceed.  History of Present Illness Wendy Shelton is a 53 year old female with asthma and supraventricular tachycardia who presents for evaluation of respiratory symptoms and allergy management.  At today's visit, she reports her asthma has been moderately well-controlled with occasional shortness of breath during physical activity.  She continues albuterol  or Airsupra  interchangeably if needed for shortness of breath which usually occurs with exertion such as bike riding or walking up the hill.  She reports relief of symptoms with rescue inhaler use. She has a new diagnosis of supraventricular tachycardia (SVT). She is not on medication for it and has not noticed any flutters recently. She experiences mild palpitations and jitteriness with albuterol  use but distinguishes these from SVT flutters, which are more pronounced and sometimes triggered by her ADHD medication. She also notes increased anxiety and palpitations after consuming alcohol.  We discussed the differences and similarities between levalbuterol and albuterol .  She reports that she did have COVID at the end of August and used her rescue inhaler during that.  With relief of  symptoms.  She did not take Paxlovid  at that time.  Pruritus is reported as well-controlled with the exception of the last several weeks which she has been out of the loratadine .  She experiences itching on her hands and hairline, which she attributes to stress or lack of allergy medication. She has not taken her allergy medicine, loratadine , for about a week, which usually alleviates the itching. She denies needing to take it more than once a day.  Allergic rhinitis is reported as moderately well-controlled with occasional nasal symptoms for which she continues loratadine , Flonase  and Atrovent  as needed.  She reports poor Flonase  application technique.Her last environmental allergy skin testing on 10/26/2016 was positive to ragweed pollen, weed pollen, tree pollen, grass pollen, mold, dust mite, cat, dog, and cockroach.  Eustachian tube dysfunction is reported as moderately well-controlled with occasional feeling of fluid and occasional popping.  She denies ear pain or change in hearing.  Reflux is reported as well-controlled with no use including heartburn or vomiting.  She is not currently taking famotidine  or any medication to control reflux.  Her current medications are listed in the chart.   Drug Allergies:  Allergies  Allergen Reactions   Penicillins Hives   Sulfa Antibiotics Rash    Rash    Amoxicillin Rash   Cephalexin Rash   Penicillin G Nausea And Vomiting   Sulfur Nausea And Vomiting    Physical Exam: There were no vitals taken for this visit.   Physical Exam Vitals reviewed.  Constitutional:      Appearance: Normal appearance.  HENT:     Head: Normocephalic and atraumatic.     Right Ear: Tympanic membrane normal.  Left Ear: Tympanic membrane normal.     Nose:     Comments: Bilateral nares edematous and pale with thin clear nasal drainage noted.  Pharynx normal.  Ears normal.  Eyes normal.    Mouth/Throat:     Pharynx: Oropharynx is clear.  Eyes:      Conjunctiva/sclera: Conjunctivae normal.  Cardiovascular:     Rate and Rhythm: Normal rate and regular rhythm.     Heart sounds: Normal heart sounds. No murmur heard. Pulmonary:     Effort: Pulmonary effort is normal.     Breath sounds: Normal breath sounds.     Comments: Lungs clear to auscultation Musculoskeletal:        General: Normal range of motion.     Cervical back: Normal range of motion and neck supple.  Skin:    General: Skin is warm and dry.  Neurological:     Mental Status: She is alert and oriented to person, place, and time.  Psychiatric:        Mood and Affect: Mood normal.        Behavior: Behavior normal.        Thought Content: Thought content normal.        Judgment: Judgment normal.     Diagnostics: FVC 3.47 which is 94% of predicted value, FEV1 2.89 which is 98% of predicted value.  Spirometry indicates normal ventilatory function.  Assessment and Plan: 1. Mild intermittent asthma, uncomplicated   2. Perennial and seasonal allergic rhinitis   3. LPRD (laryngopharyngeal reflux disease)   4. Dysfunction of both eustachian tubes   5. Pruritus     No orders of the defined types were placed in this encounter.   Patient Instructions   1.  Continue to Treat and prevent inflammation:   A. Continue Flonase - In the right nostril, point the applicator out toward the right ear. In the left nostril, point the applicator out toward the left ear  B. Continue nasal Atrovent  0.06% 2 sprays each nostril twice a day at this time.  Can use up to 4 times a day as needed for nasal drainage control  2.  Continue to treat and prevent reflux/LPR:   A. Consolidate caffeine including chocolate9  B. Famotidine  40 mg - 1 tablet 1 time per day   C. Replace throat clearing with swallowing maneuver  3.  If needed:   A. OTC antihistamine  B. Levalbuterol (Xopenex) - 2 inhalations every 4-6 hours (replaces albuterol ).   C. Positive pressure inflation of ears  D. Flonase  -  1-2 sprays each nostril 3-7 times per week for nasal congestion  4.  Return to clinic in 12 months or earlier if problem   Return in about 1 year (around 03/15/2025), or if symptoms worsen or fail to improve.    Thank you for the opportunity to care for this patient.  Please do not hesitate to contact me with questions.  Arlean Mutter, FNP [facility] 

## 2024-03-19 ENCOUNTER — Other Ambulatory Visit (HOSPITAL_COMMUNITY): Payer: Self-pay

## 2024-03-19 ENCOUNTER — Other Ambulatory Visit: Payer: Self-pay

## 2024-03-26 ENCOUNTER — Other Ambulatory Visit: Payer: Self-pay

## 2024-03-26 ENCOUNTER — Other Ambulatory Visit (HOSPITAL_COMMUNITY): Payer: Self-pay

## 2024-03-26 MED ORDER — PROGESTERONE MICRONIZED 100 MG PO CAPS
300.0000 mg | ORAL_CAPSULE | Freq: Every day | ORAL | 1 refills | Status: AC
  Filled 2024-03-26: qty 270, 90d supply, fill #0
  Filled 2024-06-22: qty 270, 90d supply, fill #1

## 2024-03-27 ENCOUNTER — Other Ambulatory Visit (HOSPITAL_COMMUNITY): Payer: Self-pay

## 2024-04-01 ENCOUNTER — Other Ambulatory Visit (HOSPITAL_COMMUNITY): Payer: Self-pay

## 2024-04-08 ENCOUNTER — Other Ambulatory Visit (HOSPITAL_COMMUNITY): Payer: Self-pay

## 2024-04-17 ENCOUNTER — Other Ambulatory Visit: Payer: Self-pay

## 2024-04-25 ENCOUNTER — Other Ambulatory Visit (HOSPITAL_BASED_OUTPATIENT_CLINIC_OR_DEPARTMENT_OTHER): Payer: Self-pay

## 2024-04-25 MED ORDER — VALACYCLOVIR HCL 1 G PO TABS
2000.0000 mg | ORAL_TABLET | Freq: Two times a day (BID) | ORAL | 0 refills | Status: AC
  Filled 2024-04-25: qty 4, 1d supply, fill #0

## 2024-04-28 ENCOUNTER — Other Ambulatory Visit (HOSPITAL_COMMUNITY): Payer: Self-pay

## 2024-04-28 MED ORDER — ESTRADIOL 0.025 MG/24HR TD PTTW
1.0000 | MEDICATED_PATCH | TRANSDERMAL | 1 refills | Status: AC
  Filled 2024-04-28: qty 8, 24d supply, fill #0

## 2024-04-29 ENCOUNTER — Other Ambulatory Visit (HOSPITAL_COMMUNITY): Payer: Self-pay

## 2024-04-29 ENCOUNTER — Other Ambulatory Visit: Payer: Self-pay

## 2024-05-10 ENCOUNTER — Other Ambulatory Visit (HOSPITAL_COMMUNITY): Payer: Self-pay

## 2024-05-10 MED ORDER — ESTRADIOL 0.025 MG/24HR TD PTTW
1.0000 | MEDICATED_PATCH | TRANSDERMAL | 2 refills | Status: AC
  Filled 2024-05-10: qty 8, 24d supply, fill #0

## 2024-05-17 ENCOUNTER — Other Ambulatory Visit (HOSPITAL_COMMUNITY): Payer: Self-pay

## 2024-05-17 MED ORDER — ESTRADIOL 0.01 % VA CREA
TOPICAL_CREAM | VAGINAL | 3 refills | Status: AC
  Filled 2024-05-17: qty 42.5, 90d supply, fill #0

## 2024-05-26 ENCOUNTER — Other Ambulatory Visit (HOSPITAL_COMMUNITY): Payer: Self-pay

## 2024-05-27 ENCOUNTER — Other Ambulatory Visit: Payer: Self-pay

## 2024-06-05 ENCOUNTER — Other Ambulatory Visit (HOSPITAL_BASED_OUTPATIENT_CLINIC_OR_DEPARTMENT_OTHER): Payer: Self-pay

## 2024-06-23 ENCOUNTER — Other Ambulatory Visit (HOSPITAL_COMMUNITY): Payer: Self-pay

## 2024-06-24 ENCOUNTER — Other Ambulatory Visit: Payer: Self-pay

## 2024-06-25 ENCOUNTER — Other Ambulatory Visit (HOSPITAL_COMMUNITY): Payer: Self-pay
